# Patient Record
Sex: Female | Born: 1980
Health system: Northeastern US, Community
[De-identification: ages and names within clinical notes are randomized; demographics above are authoritative.]

## PROBLEM LIST (undated history)

## (undated) DIAGNOSIS — I10 Essential (primary) hypertension: Secondary | ICD-10-CM

## (undated) DIAGNOSIS — O1490 Unspecified pre-eclampsia, unspecified trimester: Secondary | ICD-10-CM

## (undated) DIAGNOSIS — R102 Pelvic and perineal pain: Secondary | ICD-10-CM

## (undated) DIAGNOSIS — N926 Irregular menstruation, unspecified: Secondary | ICD-10-CM

## (undated) HISTORY — DX: Unspecified pre-eclampsia, unspecified trimester: O14.90

## (undated) HISTORY — DX: Pelvic and perineal pain: R10.2

## (undated) HISTORY — PX: TONSILLECTOMY ONE-HALF AGE 12/>: ENT171

## (undated) HISTORY — DX: Irregular menstruation, unspecified: N92.6

## (undated) HISTORY — PX: OB ANTEPARTUM CARE CESAREAN DLVR & POSTPARTUM: REP299

## (undated) HISTORY — PX: UNLISTED PROCEDURE PHARYNX ADENOIDS/TONSILS: ENT194

---

## 2002-11-02 ENCOUNTER — Encounter (HOSPITAL_BASED_OUTPATIENT_CLINIC_OR_DEPARTMENT_OTHER): Payer: Self-pay | Admitting: Internal Medicine

## 2002-11-02 ENCOUNTER — Encounter (HOSPITAL_BASED_OUTPATIENT_CLINIC_OR_DEPARTMENT_OTHER): Payer: Self-pay

## 2002-11-04 ENCOUNTER — Encounter (HOSPITAL_BASED_OUTPATIENT_CLINIC_OR_DEPARTMENT_OTHER): Payer: Self-pay | Admitting: Internal Medicine

## 2003-01-20 ENCOUNTER — Ambulatory Visit (HOSPITAL_BASED_OUTPATIENT_CLINIC_OR_DEPARTMENT_OTHER): Payer: Self-pay | Admitting: Internal Medicine

## 2003-01-20 VITALS — BP 100/58 | Wt 95.0 lb

## 2003-01-20 DIAGNOSIS — Z3009 Encounter for other general counseling and advice on contraception: Principal | ICD-10-CM

## 2003-01-20 MED ORDER — DEPO-PROVERA 150 MG/ML IM SUSP
INTRAMUSCULAR | Status: DC
Start: 2003-01-20 — End: 2003-03-31

## 2003-01-20 NOTE — Progress Notes (Signed)
Casey [name] is a 22 year old female who is here for a followup depo    Date last pap: needs pap.  Last Depo-Provera: 11/04/02.  Side Effects if any: none, really likes it, no bleeding, no headaches, no nausea,no abdominal pain.  Serum HCG indicated? no.  Depo-Provera 150 mg IM Lot# [id number] exp 5/07 given by: Tyler Pita left gluteal wihtout problems .  Next appointment due March 24-April 8th.

## 2003-03-31 ENCOUNTER — Ambulatory Visit (HOSPITAL_BASED_OUTPATIENT_CLINIC_OR_DEPARTMENT_OTHER): Payer: PRIVATE HEALTH INSURANCE

## 2003-03-31 VITALS — BP 100/52 | Wt 100.0 lb

## 2003-03-31 DIAGNOSIS — Z3009 Encounter for other general counseling and advice on contraception: Principal | ICD-10-CM

## 2003-03-31 MED ORDER — DEPO-PROVERA 150 MG/ML IM SUSP
INTRAMUSCULAR | Status: DC
Start: 2003-03-31 — End: 2003-11-28

## 2003-03-31 NOTE — Nursing Note (Signed)
>>   Casey [name]                03/31/2003 9:39 am  Orson Gear interpretered the visit.  Pt was here for her Depoprovera injection Lot # [id number] exp date 5/07. injection was given in her right buttocks with difficulty. Pt denies any problems or questions. pt is to return to clinic either the week of June 25,July 2, or July 9.

## 2003-06-09 ENCOUNTER — Encounter (HOSPITAL_BASED_OUTPATIENT_CLINIC_OR_DEPARTMENT_OTHER): Payer: PRIVATE HEALTH INSURANCE

## 2003-06-16 ENCOUNTER — Ambulatory Visit (HOSPITAL_BASED_OUTPATIENT_CLINIC_OR_DEPARTMENT_OTHER): Payer: PRIVATE HEALTH INSURANCE

## 2003-06-16 VITALS — BP 118/80 | Wt 102.0 lb

## 2003-06-16 DIAGNOSIS — Z3009 Encounter for other general counseling and advice on contraception: Principal | ICD-10-CM

## 2003-06-16 LAB — URINE PREGNANCY TEST (POINT OF CARE): HCG QUALITATIVE URINE: NEGATIVE

## 2003-06-16 MED ORDER — DEPO-PROVERA 150 MG/ML IM SUSP
INTRAMUSCULAR | Status: DC
Start: 2003-06-16 — End: 2003-11-28

## 2003-06-16 NOTE — Nursing Note (Signed)
>>   KNIGHT, JENNIFER                  06/16/2003 1:31 pm  Pt was here for her Depoprovera injection Lot [id number] exp date 6/7  Injection was given in her left gluteal without difficulty. pt is RTC the week of either 08/25/03- 09/08/03 for her next Depoprovera injection. pt denies any questions at this time. pt asked if she has to keep getting pregnancy test before every test. i explianed that i did not know she was getting a pregnancy test before every Depo shot. I explained to her that i will check with her PCP and see if this should continue. Wilson presnt during visit.

## 2003-06-23 ENCOUNTER — Encounter (HOSPITAL_BASED_OUTPATIENT_CLINIC_OR_DEPARTMENT_OTHER): Payer: PRIVATE HEALTH INSURANCE

## 2003-08-25 ENCOUNTER — Ambulatory Visit (HOSPITAL_BASED_OUTPATIENT_CLINIC_OR_DEPARTMENT_OTHER): Payer: PRIVATE HEALTH INSURANCE

## 2003-08-25 ENCOUNTER — Encounter (HOSPITAL_BASED_OUTPATIENT_CLINIC_OR_DEPARTMENT_OTHER): Payer: PRIVATE HEALTH INSURANCE

## 2003-08-25 VITALS — BP 100/64 | Wt 104.0 lb

## 2003-08-25 DIAGNOSIS — Z3009 Encounter for other general counseling and advice on contraception: Principal | ICD-10-CM

## 2003-08-25 DIAGNOSIS — Z309 Encounter for contraceptive management, unspecified: Secondary | ICD-10-CM | POA: Insufficient documentation

## 2003-08-25 MED ORDER — DEPO-PROVERA 150 MG/ML IM SUSP
INTRAMUSCULAR | Status: DC
Start: 2003-08-25 — End: 2003-11-28

## 2003-08-25 NOTE — Nursing Note (Signed)
>>   Casey [name]                08/25/2003 10:10 am  Interpreter Madaline Guthrie was present during the beginning of the visit. pt denies any questions.  Pt was here for her Depoprovera injection Lot [id number] exp date 05/2006 Injection was given in her left buttocks without difficulty. pt is RTC the week of either Novemeber 19 - Nov 17 2003 for her next Depoprovera injection. pt denies any questions at this time

## 2003-11-17 ENCOUNTER — Ambulatory Visit (HOSPITAL_BASED_OUTPATIENT_CLINIC_OR_DEPARTMENT_OTHER): Payer: PRIVATE HEALTH INSURANCE

## 2003-11-17 VITALS — BP 100/60 | Wt 109.0 lb

## 2003-11-17 DIAGNOSIS — Z3009 Encounter for other general counseling and advice on contraception: Secondary | ICD-10-CM

## 2003-11-17 NOTE — Nursing Note (Signed)
>>   Casey [name]     11/20/2003   8:57 am  Orson Gear med assistance was present to interpreter for pt. Pt was here for her Depoprovera injection Lot [id number] exp date 05/2006  Injection was given in her left buttocks without difficulty. pt is RTC the week of either Feb 11- Feb 25        for her next Depoprovera injection. pt denies any questions at this time

## 2003-11-20 MED ORDER — DEPO-PROVERA 150 MG/ML IM SUSP
INTRAMUSCULAR | Status: DC
Start: 2003-11-20 — End: 2005-08-06

## 2003-11-28 ENCOUNTER — Ambulatory Visit (HOSPITAL_BASED_OUTPATIENT_CLINIC_OR_DEPARTMENT_OTHER): Payer: PRIVATE HEALTH INSURANCE | Admitting: Internal Medicine

## 2003-11-28 ENCOUNTER — Encounter (HOSPITAL_BASED_OUTPATIENT_CLINIC_OR_DEPARTMENT_OTHER): Payer: Self-pay | Admitting: Internal Medicine

## 2003-11-28 VITALS — BP 100/60 | Wt 107.0 lb

## 2003-11-28 DIAGNOSIS — Z0000 Encounter for general adult medical examination without abnormal findings: Secondary | ICD-10-CM

## 2003-11-28 DIAGNOSIS — N83209 Unspecified ovarian cyst, unspecified side: Secondary | ICD-10-CM

## 2003-11-28 DIAGNOSIS — N949 Unspecified condition associated with female genital organs and menstrual cycle: Secondary | ICD-10-CM

## 2003-11-28 LAB — CYTOPATH, C/V, THIN LAYER

## 2003-11-28 NOTE — Progress Notes (Signed)
Casey [name] is a 22 year old female here with some pelvic cramping for one month, not constant, located more on the left, which she thinks feels like her ovarian cyst that she had a few years ago. The patient denies dysuria, frequency or hematuria. No vagainal discharge. SA, one partner. Last papa 2 years ago.     Allergies: Review of patient's allergies indicates no known allergies.   No LMP recorded.    ROS: Feeling well. No dyspnea or chest pain on exertion. No abdominal pain, change in bowel habits, black or bloody stools. No urinary tract symptoms. GYN ROS: no breast pain or new or enlarging lumps on self exam, no side effects of hormonal medications, no vaginal bleeding.    OBJECTIVE:   The patient appears well, in NAD.   BP 100/60  Wt 107 lbs (48.5kg)  ENT normal. Neck supple. No adenopathy or thyromegaly. PERLA. Clear, good air entry, no wheezes, rhonci or rales. S1 and S2 normal, no murmurs, regular rate and rhythm. No edema. Abdomen soft without tenderness, guarding, mass or organomegaly.    BREAST EXAM: normal without suspicious masses, skin or nipple changes or axillary nodes, symmetric fibrous changes in both upper outer quadrants    PELVIC EXAM: normal vagina and vulva, normal cervix without lesions, polyps or tenderness, uterus normal size, shape, consistency, no mass or tenderness, adnexa normal in size without mass or tenderness    ASSESSMENT:     well woman  no contraindication to continue hormonal therapy      PLAN:   refer to [place] to check for left ovarian cyst  GC/ chlam  pap smear  UTD on tetanus  RTc when next depo due  return annually or prn

## 2003-11-29 LAB — IADNA NEISSERIA GONORRHOEAE DIRECT PROBE TQ: GENPROBE GC: NEGATIVE

## 2003-11-29 LAB — IADNA CHLAMYDIA TRACHOMATIS DIRECT PROBE TQ: GENPROBE CHLAMYDIA: NEGATIVE

## 2003-12-11 ENCOUNTER — Encounter (HOSPITAL_BASED_OUTPATIENT_CLINIC_OR_DEPARTMENT_OTHER): Payer: Self-pay | Admitting: Internal Medicine

## 2003-12-22 ENCOUNTER — Other Ambulatory Visit: Payer: Self-pay | Admitting: Internal Medicine

## 2003-12-22 DIAGNOSIS — R1032 Left lower quadrant pain: Principal | ICD-10-CM

## 2003-12-28 LAB — US PELVIC NON-PREGNANT

## 2003-12-28 LAB — US TRANSVAGINAL NON-OB

## 2004-02-09 ENCOUNTER — Ambulatory Visit (HOSPITAL_BASED_OUTPATIENT_CLINIC_OR_DEPARTMENT_OTHER): Payer: Self-pay

## 2004-02-09 DIAGNOSIS — Z3009 Encounter for other general counseling and advice on contraception: Secondary | ICD-10-CM

## 2004-02-09 NOTE — Nursing Note (Signed)
>>   [name], Casey     02/12/2004   12:25 pm  Pt was here for her Depoprovera injection Lot  exp date 8/07  Injection was given in her right buttocks without difficulty. pt is RTC the week of either Apr 17, 2004 - May 01, 2004 for her next Depoprovera injection. pt denies any questions at this time. Interpeter via phone was used for the beginning of the visit   pt recently saw pcp for pap smearc

## 2004-02-12 MED ORDER — DEPO-PROVERA 150 MG/ML IM SUSP
INTRAMUSCULAR | Status: AC
Start: 2004-02-12 — End: 2004-02-12

## 2004-12-11 ENCOUNTER — Ambulatory Visit (HOSPITAL_BASED_OUTPATIENT_CLINIC_OR_DEPARTMENT_OTHER): Payer: Charity

## 2004-12-11 ENCOUNTER — Encounter (HOSPITAL_BASED_OUTPATIENT_CLINIC_OR_DEPARTMENT_OTHER): Payer: Self-pay

## 2004-12-11 VITALS — BP 100/60 | HR 76 | Temp 99.1°F | Resp 12 | Ht <= 58 in | Wt 97.0 lb

## 2004-12-11 DIAGNOSIS — Z3009 Encounter for other general counseling and advice on contraception: Secondary | ICD-10-CM

## 2004-12-11 DIAGNOSIS — N912 Amenorrhea, unspecified: Secondary | ICD-10-CM

## 2004-12-11 LAB — URINE PREGNANCY TEST (POINT OF CARE): HCG QUALITATIVE URINE: NEGATIVE

## 2004-12-11 NOTE — Progress Notes (Signed)
History:  Date of last menstrual period :  Patient's last menstrual period was 11/13/2004.  It was On time.  Was it a normal period(flow,duration, etc.)? yes    Are you using any kind of contraception? No    Any unprotected intercourse in the past 2 months? yes  Any unprotected intercourse in the past 3 days?* yes  (* may be a candidate for emergency contraceptions)    Have you ever been pregnant in the past? yes  Any symptoms of pregnancy? Yes, breast tenderness.    If the test is negative, how will you feel? mixed feelings    If the test is positive, how will you feel? confused    Test Result: Negative    Education/Counseling Provided:  Test is Negative: contraception  follow up pap visit  follow up hcg jan 10 serum for depo she will nothave unprotected intercourse

## 2004-12-24 ENCOUNTER — Ambulatory Visit (HOSPITAL_BASED_OUTPATIENT_CLINIC_OR_DEPARTMENT_OTHER): Payer: Charity

## 2004-12-24 VITALS — BP 100/60 | HR 98 | Temp 99.0°F | Resp 12 | Ht <= 58 in | Wt 97.0 lb

## 2004-12-24 DIAGNOSIS — Z3009 Encounter for other general counseling and advice on contraception: Principal | ICD-10-CM

## 2004-12-24 LAB — URINE PREGNANCY TEST (POINT OF CARE): HCG QUALITATIVE URINE: NEGATIVE

## 2004-12-24 MED ORDER — DEPO-PROVERA 150 MG/ML IM SUSP
INTRAMUSCULAR | Status: DC
Start: 2004-12-24 — End: 2005-08-06

## 2004-12-24 NOTE — Nursing Note (Signed)
>>   Casey [name]     12/24/2004   11:47 am  Depo lot # , exp 06/2007, to left buttock as ordered. [place] interpreter present. Pt. denies any questions.

## 2004-12-24 NOTE — Progress Notes (Signed)
Casey [name], 24 year old, presents today for an initial Depo Provera injection.     There is no previous medical history on file.    There is no previous surgical history on file.    Obstetric History   G1 P1 T0 P0 TAB0 SAB0 E0 M0 L0     Social Risk:   Tobacco Use: Never    Alcohol Use: No    Sexually Active: Yes Partners with: Female      GYN EXAM: DUE WILL SCHEDULE    Last 5 LMP Dates  Dec 16, 2004  SHE DENIES BEING SEXUALLY ACTIVE SINCE LMP COUNSELLED PATIENT REGARDING DEPO  SHE WILL TAKE URINE HCG AS A PRECAUTIONARY MEASURE     Date: LMP:   11/13/2004    Last 2 Urine HCG tests:  HCG QUALITATIVE URINE (no units)   Date Value   12/11/2004 neg    06/16/2003 neg   ----------    Family Planning Consent: yes    Side effects reveiwed and patient understands:   Bleeding patterns: yes   Possible mood changes: yes   Changes to libido: yes    Injection given: yes    Patient instructed to return to the clinic the week of either   12 weeks for her next Depo-Provera injection.      Patient denies any questions at this time.

## 2005-03-05 ENCOUNTER — Ambulatory Visit (HOSPITAL_BASED_OUTPATIENT_CLINIC_OR_DEPARTMENT_OTHER): Payer: Charity

## 2005-03-05 VITALS — BP 100/60 | Wt 97.0 lb

## 2005-03-05 DIAGNOSIS — Z3009 Encounter for other general counseling and advice on contraception: Principal | ICD-10-CM

## 2005-03-05 MED ORDER — DEPO-PROVERA 150 MG/ML IM SUSP
INTRAMUSCULAR | Status: DC
Start: 2005-03-05 — End: 2005-08-06

## 2005-03-05 NOTE — Nursing Note (Signed)
>>   [name], Casey     03/05/2005   1:48 pm  Pt was here for her Depoprovera injection Lot # exp date 06/2007  Injection was given in her left buttocks without difficulty. pt is RTC between the weeks of May 31 - June 14 for her next Depoprovera injection. pt denies any questions at this time. Pt declined an interpreter for this visit

## 2005-05-14 ENCOUNTER — Ambulatory Visit (HOSPITAL_BASED_OUTPATIENT_CLINIC_OR_DEPARTMENT_OTHER): Payer: Charity | Admitting: Registered Nurse

## 2005-05-14 VITALS — BP 120/70 | Temp 100.4°F | Wt 100.0 lb

## 2005-05-14 DIAGNOSIS — Z3009 Encounter for other general counseling and advice on contraception: Secondary | ICD-10-CM

## 2005-05-14 MED ORDER — DEPO-PROVERA 150 MG/ML IM SUSP
INTRAMUSCULAR | Status: DC
Start: 2005-05-14 — End: 2006-12-02

## 2005-05-14 NOTE — Nursing Note (Signed)
>>   [name], Casey     05/14/2005   4:26 pm  Pt. here for Depo shot. Within the time frame for receiving Depo. Depo lot # exp 06/2007 to left buttock without difficulty. To return to clinic between August 9 and August 06, 2005. Pt denies any questions.

## 2005-07-23 ENCOUNTER — Ambulatory Visit (HOSPITAL_BASED_OUTPATIENT_CLINIC_OR_DEPARTMENT_OTHER): Payer: Charity | Admitting: Registered Nurse

## 2005-07-23 ENCOUNTER — Encounter (HOSPITAL_BASED_OUTPATIENT_CLINIC_OR_DEPARTMENT_OTHER): Payer: Self-pay | Admitting: Registered Nurse

## 2005-07-23 VITALS — BP 110/60 | Temp 99.2°F | Ht <= 58 in | Wt 103.0 lb

## 2005-07-23 DIAGNOSIS — Z3009 Encounter for other general counseling and advice on contraception: Principal | ICD-10-CM

## 2005-07-23 MED ORDER — DEPO-PROVERA 150 MG/ML IM SUSP
INTRAMUSCULAR | Status: DC
Start: 2005-07-23 — End: 2005-08-06

## 2005-07-23 NOTE — Nursing Note (Signed)
>>   BOWE, BARBARA     07/23/2005   10:28 am  Pt. here for Depo shot. Within the time frame for receiving Depo. Depo lot # exp 06/2007 to right buttock without difficulty. To return to clinic between Oct 18 and Oct 15, 2005. Pt denies any questions.   Unable to find documentation of a recent PAP. Appointment scheduled for 08/06/05 with Ladora Daniel. Pt advised that future depo shots depend on her keeping this appointment. She agreed.

## 2005-08-06 ENCOUNTER — Encounter (HOSPITAL_BASED_OUTPATIENT_CLINIC_OR_DEPARTMENT_OTHER): Payer: Self-pay

## 2005-08-06 ENCOUNTER — Ambulatory Visit (HOSPITAL_BASED_OUTPATIENT_CLINIC_OR_DEPARTMENT_OTHER): Payer: Charity

## 2005-08-06 VITALS — BP 100/60 | HR 60 | Temp 98.0°F | Resp 10 | Ht <= 58 in | Wt 105.0 lb

## 2005-08-06 DIAGNOSIS — I4949 Other premature depolarization: Secondary | ICD-10-CM

## 2005-08-06 DIAGNOSIS — F809 Developmental disorder of speech and language, unspecified: Secondary | ICD-10-CM | POA: Insufficient documentation

## 2005-08-06 DIAGNOSIS — Z0000 Encounter for general adult medical examination without abnormal findings: Secondary | ICD-10-CM

## 2005-08-06 LAB — CYTOPATH, C/V, THIN LAYER

## 2005-08-06 NOTE — Progress Notes (Signed)
SUBJECTIVE:   24 year old female for annual routine pap and checkup.    Current outpatient prescriptions:  DEPO-PROVERA 150 MG/ML IM SUSP, 1 ML EVERY 3 MONTHS, Disp: .9, Rfl: 0    Allergies: Review of patient's allergies indicates no known allergies.   No LMP date recorded. Reason: Injection.    ROS: Feeling well. No dyspnea or chest pain on exertion. No abdominal pain, change in bowel habits, black or bloody stools. No urinary tract symptoms. GYN ROS: no breast pain or new or enlarging lumps on self exam, no discharge or pelvic pain. HAPPY WITH DEPO, WOULD LIKE TO CONTINUE.    OBJECTIVE:   The patient appears well, in NAD.   BP 100/60   Pulse 60, regular   Temp (Src) 98 (Oral)   Resp 10   Ht 4\' 10"  (1.60m)   Wt 105 lbs (47.6kg)   LMP Injection  ENT normal. ABSENT UVULA AND ABNORMALITY OF TONGUE Neck supple. No adenopathy or thyromegaly. PERLA. Clear, good air entry, no wheezes, rhonci or rales. S1 and S2 normal, no murmurs, IRREGULAR RYTHYM. No edema. Abdomen soft without tenderness, guarding, mass or organomegaly.    BREAST EXAM: normal without suspicious masses, skin or nipple changes or axillary nodes, symmetric fibrous changes in both upper outer quadrants, self-exam is taught and encouraged    PELVIC EXAM: EGBUS within normal limits, normal cervix without lesions, polyps or tenderness, uterus normal size, shape, consistency, no mass or tenderness, adnexa normal in size without mass or tenderness    EKG reviewed by Dr.Soderlund, PVCs ?unifocal, pt asymptomatic    ASSESSMENT:   well woman  no contraindication to continue hormonal therapy  PVCs    PLAN:   pap smear, genprobe  counseled on breast self exam and family planning choices- reviewed effects of long term depo use, advised calcium and Vit D supplements  return annually or prn

## 2005-08-08 LAB — CHLAMYDIA GC NAAT
GENPROBE CHLAMYDIA: NEGATIVE
GENPROBE GC: NEGATIVE

## 2005-08-13 ENCOUNTER — Encounter (HOSPITAL_BASED_OUTPATIENT_CLINIC_OR_DEPARTMENT_OTHER): Payer: Self-pay

## 2005-10-02 ENCOUNTER — Ambulatory Visit (HOSPITAL_BASED_OUTPATIENT_CLINIC_OR_DEPARTMENT_OTHER): Payer: Charity | Admitting: Registered Nurse

## 2005-10-02 VITALS — BP 100/60 | Wt 106.0 lb

## 2005-10-02 DIAGNOSIS — Z3009 Encounter for other general counseling and advice on contraception: Principal | ICD-10-CM

## 2005-10-02 MED ORDER — DEPO-PROVERA 150 MG/ML IM SUSP
INTRAMUSCULAR | Status: DC
Start: 2005-10-02 — End: 2006-12-02

## 2005-10-02 NOTE — Nursing Note (Signed)
>>   [name], Casey     10/02/2005   9:56 am  Pt. here for Depo shot. Within the time frame for receiving Depo. Depo lot # exp 07/2007 to right buttock without difficulty. To return to clinic between Dec 11, 2005 and Dec 25, 2005. Pt denies any questions.

## 2005-12-24 ENCOUNTER — Ambulatory Visit (HOSPITAL_BASED_OUTPATIENT_CLINIC_OR_DEPARTMENT_OTHER): Payer: Charity | Admitting: Registered Nurse

## 2005-12-24 VITALS — BP 110/70 | Temp 98.6°F | Ht <= 58 in | Wt 104.0 lb

## 2005-12-24 DIAGNOSIS — Z3009 Encounter for other general counseling and advice on contraception: Secondary | ICD-10-CM

## 2005-12-24 MED ORDER — DEPO-PROVERA 150 MG/ML IM SUSP
INTRAMUSCULAR | Status: DC
Start: 2005-12-24 — End: 2006-12-02

## 2005-12-24 NOTE — Nursing Note (Signed)
>>   BOWE, BARBARA     12/24/2005   2:45 pm  Pt. here for Depo shot. Within the time frame for receiving Depo. Depo lot # exp 07/2007 to left buttock without difficulty. To return to clinic between March 20 to March 17, 2006. Pt denies any questions.

## 2006-03-11 ENCOUNTER — Ambulatory Visit (HOSPITAL_BASED_OUTPATIENT_CLINIC_OR_DEPARTMENT_OTHER): Payer: Charity

## 2006-03-11 VITALS — BP 90/60 | Temp 97.8°F | Wt 104.0 lb

## 2006-03-11 DIAGNOSIS — Z3009 Encounter for other general counseling and advice on contraception: Principal | ICD-10-CM

## 2006-03-11 MED ORDER — DEPO-PROVERA 150 MG/ML IM SUSP
INTRAMUSCULAR | Status: DC
Start: 2006-03-11 — End: 2006-12-02

## 2006-03-11 NOTE — Progress Notes (Deleted)
Casey [name], 25 year old, presents today for an initial Depo Provera injection.     No past medical history on file.    No past surgical history on file.    Obstetric History   G1 P1 T0 P0 TAB0 SAB0 E0 M0 L0     Social Risk:   Tobacco Use: Never    Alcohol Use: No    Sexual Activity: Yes Partners with: Female   Birth Control/Protection: Injection      GYN EXAM: ***    Last 5 LMP Dates  Last LMP Date(s)   Date: LMP:   12/11/2004 11/13/2004    Last 2 Urine HCG tests:  HCG QUALITATIVE URINE (no units)  Date Value  12/24/2004 neg   12/11/2004 neg   ----------    Family Planning Consent: {YES/NO:11106::"yes"}    Side effects reveiwed and patient understands:   Bleeding patterns: {YES/NO:11106::"yes"}   Possible mood changes: {YES/NO:11106::"yes"}   Changes to libido: {YES/NO:11106::"yes"}    Injection given: {YES/NO* * *:64::"Yes"}    Patient instructed to return to the clinic the week of either *** or *** for her next Depo-Provera injection.    Patient denies any questions at this time.

## 2006-03-11 NOTE — Nursing Note (Signed)
>>   CANZANELLI, LISA     03/11/2006   10:07 am  Pt was here for her Depo-Provera injection Lot #[id number] exp date 07/2009. Pt is within the time frame for receiving the injection. Injection was given in her right buttock without difficulty. Pt is RTC between the weeks of 6/6-6/20  for her next Depo-Provera injection. Pt denies any questions at this time.

## 2006-03-11 NOTE — Patient Instructions (Signed)
Return to clinic between June 6 and June 20 for your next injection.

## 2006-05-20 ENCOUNTER — Ambulatory Visit (HOSPITAL_BASED_OUTPATIENT_CLINIC_OR_DEPARTMENT_OTHER): Payer: Charity | Admitting: Registered Nurse

## 2006-05-20 ENCOUNTER — Encounter (HOSPITAL_BASED_OUTPATIENT_CLINIC_OR_DEPARTMENT_OTHER): Payer: Self-pay | Admitting: Internal Medicine

## 2006-05-20 DIAGNOSIS — Z3009 Encounter for other general counseling and advice on contraception: Secondary | ICD-10-CM

## 2006-05-20 MED ORDER — DEPO-PROVERA 150 MG/ML IM SUSP
INTRAMUSCULAR | Status: DC
Start: 2006-05-20 — End: 2006-05-20

## 2006-05-20 NOTE — Progress Notes (Signed)
Casey [name], 25 year old, presents today for a repeat Depo Provera injection.     Date of last Injection: 03/11/06    Any changes in:   Bleeding patterns: No   Possible mood changes: No   Changes to libido: No    Injection given: Yes    Patient instructed to return to the clinic the week of either Aug 15 or Aug 29 for her next Depo-Provera injection.    Patient denies any questions at this time.

## 2006-08-12 ENCOUNTER — Ambulatory Visit (HOSPITAL_BASED_OUTPATIENT_CLINIC_OR_DEPARTMENT_OTHER): Payer: Charity | Admitting: Registered Nurse

## 2006-08-12 VITALS — BP 128/70 | Temp 99.1°F | Wt 111.0 lb

## 2006-08-12 DIAGNOSIS — Z3009 Encounter for other general counseling and advice on contraception: Secondary | ICD-10-CM

## 2006-08-12 NOTE — Nursing Note (Signed)
>>   [name], Casey     08/12/2006   10:30 am  Pt. here for Depo shot. Within the time frame for receiving Depo. Depo lot # [id number] exp 07/2009 to left buttock without difficulty. To return to clinic between Oct 21, 2006 to 11/04/06 for next depo. ACHES reviewed. Pt denies any problems/questions.

## 2006-11-18 ENCOUNTER — Encounter (HOSPITAL_BASED_OUTPATIENT_CLINIC_OR_DEPARTMENT_OTHER): Payer: Self-pay

## 2006-11-18 ENCOUNTER — Ambulatory Visit (HOSPITAL_BASED_OUTPATIENT_CLINIC_OR_DEPARTMENT_OTHER): Payer: PRIVATE HEALTH INSURANCE

## 2006-11-18 DIAGNOSIS — Z3009 Encounter for other general counseling and advice on contraception: Secondary | ICD-10-CM

## 2006-11-18 NOTE — Progress Notes (Signed)
Family Planning Counseling Note:  S: History   Reason for Visit: talk about birth control, maybe get IUD  Current meds:[place] OPCs. Allergies: NKDA.    ETOH: none. Drugs: none. Cigarettes per day: no    LMP: 3 years ago Pt has been using Depo Provera   Menarche: 9 or 10 years y.o.   Regular cycles? Before Depo Provera yes  (how often: monthly #days: 5 amt.: average cramping: some)  Age of First Intercourse: 25 y.o.   Gravida: 1 Para: 1 SAB: 0 TAB: 0    Current Partner: 7 years with same partner   # of partners: 1 for 7 years  Sexual Partners: male  Sexual Practices: vaginal   DV Assessment completed. The Patient reported No Hx of DV/IP Abuse, No Hx of sexual assault, No Sexual Coersion.    Current method of contraception: Pt was using Depo Provera for 3 years. She is gaining weight and does not like missing her period. She decided not to get her last Depo Provera shot at the end of Nov. And began taking [place] OCPs on 10/30/06.   Satisfaction with Method: No    Date of last SA: more than week ago  Protection used: Yes   UCG required: No   ECC Need: No  Condom UJW:[id number]        Condom Instruction Given:       Hx of STDs (dx/tx): Tested Before?:No   Tested for GC and Chlamydia   HIV: 5 years ago during pregnancy    Testing Offered: Yes   Testing Accepted: No    GYN: Last GYN exam (date/result): 07/2005 The Pt does not have a Hx of abnormal PAP.    O: Pregnancy Test Result, if done: not done           A: FP               P: Education/Counseling and referrals:  Breast/testicular self-exams   Anatomy/physiology  GYN exam   Pregnancy  Contraceptive overview  Pt would like a Pargard IUD   Disc: Risks, Benefits, Warning signs, Effectiveness, Side effects, Instructions , Follow up  Consent for treatment   Informed Consent signed for: Family Planning and IUD  STD/HIV risk assessment done   STD/HIV prevention/safer sex discussed  Condoms offered and refused  Emergency Contraception discussed  Confidentiality  Clinic  contact number both day and after hours/emergency   Handouts given    Follow up:Pt will RTC to see EDewitt Hoes for PAP, genprobe, and to schedule IUD insetion

## 2006-12-02 ENCOUNTER — Ambulatory Visit (HOSPITAL_BASED_OUTPATIENT_CLINIC_OR_DEPARTMENT_OTHER): Payer: PRIVATE HEALTH INSURANCE

## 2006-12-02 ENCOUNTER — Encounter (HOSPITAL_BASED_OUTPATIENT_CLINIC_OR_DEPARTMENT_OTHER): Payer: Self-pay

## 2006-12-02 VITALS — BP 128/78 | HR 97 | Wt 114.0 lb

## 2006-12-02 DIAGNOSIS — Z0000 Encounter for general adult medical examination without abnormal findings: Secondary | ICD-10-CM

## 2006-12-02 DIAGNOSIS — Z3009 Encounter for other general counseling and advice on contraception: Secondary | ICD-10-CM

## 2006-12-02 NOTE — Progress Notes (Signed)
SUBJECTIVE:   25 year old female for annual routine pap and checkup.  No current outpatient prescriptions on file.    Allergies: Review of patient's allergies indicates no known allergies.   No LMP date recorded. Reason: Injection.    Patient Active Problem List:   CONTRACEPTIVE MANGMT NEC [V25.09]   SPEECH/LANGUAGE DISORDER [315.3]    Current outpatient prescriptions prior to 12/02/06:  DISCONTD: DEPO-PROVERA 150 MG/ML IM SUSP, INTIATE TODAY; REPEAT EVERY 10 TO 13 WEEKS FOR CONTRACEPTION, Disp: 1, Rfl: 3  DISCONTD: DEPO-PROVERA 150 MG/ML IM SUSP, 1 ML EVERY 3 MONTHS, Disp: .9, Rfl: 0  DISCONTD: DEPO-PROVERA 150 MG/ML IM SUSP, 1 ML EVERY 3 MONTHS, Disp: .9, Rfl: 0  DISCONTD: DEPO-PROVERA 150 MG/ML IM SUSP, 1 ML EVERY 3 MONTHS, Disp: .9, Rfl: 0        ROS: Feeling well. No dyspnea or chest pain on exertion. No abdominal pain, change in bowel habits, black or bloody stools. No urinary tract symptoms. GYN ROS: no breast pain or new or enlarging lumps on self exam. Amen s/p depo. Currently using [place] OCP until IUD insertion, signed consent for paragard IUD.    No past medical history on file.  Past Surgical History:   THROAT SURGERY PROCEDURE    Comment: age 82   CESAREAN DELIVERY    Comment: age 35    Review of patient's family history indicates:   In Good Health Mother    In Good Health Father    Cancer - Other FamHxNeg    Diabetes FamHxNeg    Heart FamHxNeg       Social History   Marital Status: Single Spouse Name:    Years of Education: Number of children: 1     Occupational History  Occupation Radio producer     Social History Main Topics   Tobacco Use: Never    Alcohol Use: Yes    Comment: special occasions   Drug Use: No    Sexual Activity: Yes Partners with: Female   Birth Control/Protection: Pill, Injection    Other Topics Concern  CAFFEINE No   Comment: 1 cup coffee/day  SLEEP CONCERN No  STRESS CONCERN No  EXERCISE Yes  SELF EXAMS Yes    Social History Narrative   12/02/2006 lives with  partner of 7 yrs, 1 son- Benjie Karvonen born 2002    OBJECTIVE:   The patient appears well, in NAD.   BP 128/78  Pulse 97  Wt 114 lbs (51.7kg)  SaO2 100%  LMP Injection  ENT normal. Neck supple. No adenopathy or thyromegaly. PERLA. Clear, good air entry, no wheezes, rhonci or rales. S1 and S2 normal, no murmurs, regular rate and rhythm. No edema. Abdomen soft without tenderness, guarding, mass or organomegaly.    BREAST EXAM: normal without suspicious masses, skin or nipple changes or axillary nodes, symmetric fibrous changes in both upper outer quadrants, self-exam is taught and encouraged    PELVIC EXAM: normal vagina and vulva, EGBUS within normal limits, normal cervix without lesions, polyps or tenderness, nulliparous os, uterus normal size, shape, consistency, no mass or tenderness, adnexa normal in size without mass or tenderness    ASSESSMENT:     well woman  no contraindication to continue use of oral contraceptives      PLAN:     pap smear/genprobe  counseled on breast self exam and family planning choice  Continue OCP until IUD insertion- schedule 3-4 weeks, call if menses    Discussed risks of IUD  insertion:   Uterus punctured during placement: less than 1 per 100;   Pelvic infection within 30 days of placement: less than 1 per 100  IUD slips from placement or falls out during first year: 1-7

## 2006-12-03 LAB — CHLAMYDIA GC NAAT
GENPROBE CHLAMYDIA: NEGATIVE
GENPROBE GC: NEGATIVE

## 2006-12-12 LAB — CYTOPATH, C/V, THIN LAYER

## 2006-12-30 ENCOUNTER — Ambulatory Visit (HOSPITAL_BASED_OUTPATIENT_CLINIC_OR_DEPARTMENT_OTHER): Payer: PRIVATE HEALTH INSURANCE

## 2006-12-30 ENCOUNTER — Encounter (HOSPITAL_BASED_OUTPATIENT_CLINIC_OR_DEPARTMENT_OTHER): Payer: Self-pay

## 2006-12-30 DIAGNOSIS — Z23 Encounter for immunization: Secondary | ICD-10-CM

## 2006-12-30 LAB — URINE PREGNANCY TEST (POINT OF CARE): HCG QUALITATIVE URINE: NEGATIVE

## 2006-12-30 NOTE — Progress Notes (Signed)
PATIENT/PROCEDURE VERIFICATION DOCUMENTATION    Correct patient: Yes  Correct procedure: Yes  Correct side, site, mark visible if applicable: Yes  Correct position: Yes  Special equipment/implant(s) present, if applicable: Yes    Time-out completed, documented by provider doing procedure or designated team member:  Ladora Daniel, APRN 12/30/2006 30:47 PM  26 year old, female, Obstetric History   G1 P1 T0 P0 TAB0 SAB0 E0 M0 L0   Patient's last menstrual period was 12/26/2006.    She is here for an IUD insertion.    Currently she is sexually active, monogomous    She denies current symptoms of possible pelvic infection, recent pelvic infection, pregnancy, rheumatic or congenital heart disease and allergy to IUD materials.    Completed Labs: negative pregnancy tests, negative test for gonorrhea, negative test for chlamydia and normal pap smear within the past 12 months    Patient has verbalized understanding of risks and benefits and has signed the consent form. Current statistical risk of pregnancy with IUD is 0.3-1%. Current statistical risk of of ectopic pregnancy with IUD is low but if pregnancy occurs ectopic risk is about 35%.    EXAM:  Uterus: normal size, non-tender and midline    PROCEDURE:  Cervix prepped with betadine. Tenaculum applied to anterior lip of the cervix. Sounded to 8cm.    IUD inserted easily.    Strings trimmed.  IUD type: Paraguard T-380    Instructions given to patient regarding checking IUD strings, anticipated vaginal bleeding and warning signs. Instructed to continue to track menstrual cycles and call for any missed periods or abnormal bleeding or pain.    Follow up appointment in 6 weeks.12/30/2006  VIS given prior to administration and reviewed with the patient and or legal guardian. Patient understands the disease and the vaccine. See immunization/Injection module or chart review for date of publication and additional information.  Ladora Daniel, APRN    V25.1 INSERTION OF IUD (primary  encounter diagnosis)  Plan: INSERT INTRAUTERINE DEVICE, INTRAUT COPPER    CONTRACEPTIVE, URINE PREGNANCY TEST (POINT OF    CARE)    V03.7 TETANUS TOXOID INOCULAT  Note: pt brought td records- last in 1997, due   Plan: IMMUNIZATION ADMIN SINGLE, RN, TDAP VACCINE IM

## 2007-01-04 ENCOUNTER — Emergency Department (HOSPITAL_BASED_OUTPATIENT_CLINIC_OR_DEPARTMENT_OTHER): Payer: Self-pay

## 2007-01-07 LAB — EMERGENCY ROOM NOTE

## 2007-02-11 ENCOUNTER — Encounter (HOSPITAL_BASED_OUTPATIENT_CLINIC_OR_DEPARTMENT_OTHER): Payer: Self-pay

## 2007-02-11 ENCOUNTER — Ambulatory Visit (HOSPITAL_BASED_OUTPATIENT_CLINIC_OR_DEPARTMENT_OTHER): Payer: PRIVATE HEALTH INSURANCE

## 2007-02-11 VITALS — BP 110/60 | HR 105 | Wt 114.0 lb

## 2007-02-11 DIAGNOSIS — D649 Anemia, unspecified: Principal | ICD-10-CM

## 2007-02-11 DIAGNOSIS — Z30431 Encounter for routine checking of intrauterine contraceptive device: Secondary | ICD-10-CM

## 2007-02-11 DIAGNOSIS — N926 Irregular menstruation, unspecified: Secondary | ICD-10-CM

## 2007-02-11 LAB — BLOOD COUNT COMPLETE AUTOMATED
HEMATOCRIT: 42.7 % (ref 36.0–48.0)
HEMOGLOBIN: 14.7 g/dl (ref 12.0–16.0)
MEAN CORP HGB CONC: 34.5 g/dl (ref 32.0–36.0)
MEAN CORPUSCULAR HGB: 32.4 pg (ref 27.0–33.0)
MEAN CORPUSCULAR VOL: 93.9 fl (ref 80.0–100.0)
MEAN PLATELET VOLUME: 8.8 fl (ref 6.4–10.8)
PLATELET COUNT: 319 10*3/uL (ref 150–400)
RBC DISTRIBUTION WIDTH: 11.9 % (ref 11.5–14.3)
RED BLOOD CELL COUNT: 4.55 M/uL (ref 4.50–5.10)
WHITE BLOOD CELL COUNT: 9 10*3/uL (ref 4.0–10.8)

## 2007-02-11 LAB — CHG ASSAY OF FERRITIN: FERRITIN: 68 ng/mL (ref 11–307)

## 2007-02-11 MED ORDER — LO/OVRAL (28) 0.3-30 MG-MCG PO TABS
ORAL_TABLET | ORAL | Status: DC
Start: 2007-02-11 — End: 2007-06-17

## 2007-02-11 NOTE — Progress Notes (Signed)
S/ Casey [name] is a 26 year old female here for IUD follow-up    She had pain and heavy bleeding for 4 days s/p placement, went to ER. Reassured. Spotting since x 6 wks, light, some days no bleeding, denies pain.    Last depo 8/29/7, she has not had regular periods since.    Past hx anemia, she would like to check.    O/  Vagina and vulva are normal; LIght bleeding present in vault. Cervix normal without lesions. IUD strings present. Uterus anteverted and mobile, normal in size and shape without tenderness. Adnexa normal in size without masses or tenderness.     285.9 ANEMIA NOS (primary encounter diagnosis)  Plan: COMPLETE CBC, AUTOMATED, ASSAY OF FERRITIN    V25.42 IUD SURVEILLANCE  Note: nl exam    626.4 IRREGULAR MENSTRUATION  S/p depo  S/p iud placement  Plan: LO/OVRAL (28) 0.3-30 MG-MCG OR TABS  Ocp x 1 month to stop bleeding. Consent signed.     Recheck as needed for persistence, worsening, appearance of new   Symptoms.

## 2007-03-16 ENCOUNTER — Other Ambulatory Visit (HOSPITAL_BASED_OUTPATIENT_CLINIC_OR_DEPARTMENT_OTHER): Payer: Self-pay

## 2007-03-16 DIAGNOSIS — N926 Irregular menstruation, unspecified: Secondary | ICD-10-CM

## 2007-03-16 NOTE — Telephone Encounter (Signed)
Due to the language barrier, the phone call was conducted in [place] with an interpreter. The interpreter's name is wilson. The interpreter was on the phone during the entire phone call.   tc to pt no answer I left a message for pt to call back so that i can inquire about the bleeding that she was having last month.

## 2007-03-16 NOTE — Telephone Encounter (Signed)
Staff Message copied by Odis Luster on Tue Mar 16, 2007 4:14 PM  ------   Message from: Maryclare Bean, MIRNA   Created: Tue Mar 16, 2007 3:40 PM   Regarding: refill   Contact: [phone]    Casey [name] [id number], 26 year old, female, Telephone Information:  Home Phone [phone]  Work Phone [phone]      Cleotis Lema NUMBER: [phone]  Cell phone:   Other phone:    Available times:    Patient's language of care: [place]    Patient needs a [place] interpreter.    Patient's PCP: Ladora Daniel, APRN    Person calling on behalf of patient: patient (self)    Calls today for med refill(s).Birth control [facility]Thanks    Patient's Preferred Pharmacy:   No Pharmacies Listed

## 2007-03-16 NOTE — Telephone Encounter (Signed)
Due to the language barrier, the phone call was conducted in [place] with an interpreter. The interpreter's name is Alvino Chapel. The interpreter was on the phone during the entire phone call.  Pt called back and she informed me that she is not having any bleeding at present time. I explained that she wants to be on the ocp for one month. Pt will call back is she has any further bleeding. Pt agreed

## 2007-06-16 ENCOUNTER — Telehealth (HOSPITAL_BASED_OUTPATIENT_CLINIC_OR_DEPARTMENT_OTHER): Payer: Self-pay

## 2007-06-16 NOTE — Telephone Encounter (Signed)
Staff Message copied by Stark Bray on Wed Jun 16, 2007 3:37 PM  ------   Message from: AFIFE, CELESTE   Created: Wed Jun 16, 2007 10:00 AM   Regarding: ?infection   Contact: [phone]    Casey [name] [id number], 26 year old, female, Telephone Information:  Home Phone [phone]  Work Phone [phone]      Cleotis Lema NUMBER: [phone]  Cell phone:   Other phone:    Available times:    Patient's language of care: [place]    Patient needs a [place] interpreter.    Patient's PCP: Ladora Daniel, APRN    Person calling on behalf of patient: patient (self)    Calls today with a sick call: ?vag. Infect. Related to the IUD - discharge w/ odor - pls advise.    Patient's Preferred Pharmacy:   [facility]  Phone: [phone] Fax: [phone]

## 2007-06-16 NOTE — Telephone Encounter (Signed)
TC to patient regarding ?vag. Infect, related to the IUD insertion. Having discharge w/ odor.

## 2007-06-17 ENCOUNTER — Ambulatory Visit (HOSPITAL_BASED_OUTPATIENT_CLINIC_OR_DEPARTMENT_OTHER): Payer: PRIVATE HEALTH INSURANCE

## 2007-06-17 VITALS — Temp 98.5°F

## 2007-06-17 DIAGNOSIS — N926 Irregular menstruation, unspecified: Secondary | ICD-10-CM

## 2007-06-17 DIAGNOSIS — R102 Pelvic and perineal pain unspecified side: Secondary | ICD-10-CM

## 2007-06-17 HISTORY — DX: Irregular menstruation, unspecified: N92.6

## 2007-06-17 HISTORY — DX: Pelvic and perineal pain unspecified side: R10.20

## 2007-06-17 HISTORY — DX: Pelvic and perineal pain: R10.2

## 2007-06-17 LAB — URINE PREGNANCY TEST (POINT OF CARE): HCG QUALITATIVE URINE: NEGATIVE

## 2007-06-17 MED ORDER — LO/OVRAL (28) 0.3-30 MG-MCG PO TABS
ORAL_TABLET | ORAL | Status: AC
Start: 2007-06-17 — End: 2007-12-18

## 2007-06-17 NOTE — Progress Notes (Signed)
SUBJECTIVE:  Casey [name] is a 25 year old female  C/o bad vaginal odor and pelvic pain/llq and irregular bleeding  Her bleeding stopped while on ocp and started again when finished the one pack  Pain started 1 month ago, stabbing, comes and goes, every day, lasts 5-10 minutes, several times/day  No abnormal discharge  Some dysuria "not a lot"  She is constipated    OBJECTIVE:  Vagina and vulva are normal; Menstrual blood is noted. Cervix normal without lesions. IUD string visible and IUD easily removed intact. Uterus anteverted and mobile, normal in size and shape without tenderness. Adnexa normal in size without masses or tenderness.    626.4 Irregular Menstrual Cycle (primary encounter diagnosis)  Comment:   Plan: LO/OVRAL (28) 0.3-30 MG-MCG OR TABS  Rx per orders    625.9D Pelvic Pain  Comment:   Plan: URINE CULTURE/COLONY COUNT, AMPLIFIED GENPROBE    CHLAM/GC   Add fiber, fluids, nutriteach given, fuv 1 month    V25.42Q IUD (Removal of Intrauterine Device)  Comment:   I advised pt bleeding not likely r/t IUD as resolved with OCP. She opts to remove as she wd like ocp to regulate menses.  Plan: REMOVE INTRAUTERINE DEVICE   Pt request    Schedule appt after menses for check vaginal infection

## 2007-06-17 NOTE — Progress Notes (Signed)
Due to the language barrier, the office visit was conducted in [place] with an interpreter. The interpreter's name is ellen. The interpreter was present during the entire visit.    Pt walked into clinic c/o lower left abd pain    ABNORMAL VAGINAL BLEEDING    Casey [name], 26 year old, female  Patient calls complaining of Vaginal Problem      Symptoms: small amount vaginal bleeding Now because she is taking the pill month(s).   This is not related to menstrual cycle.    If the answer to any of the following questions is YES then a provider visit is indicated unless the provider orders an alternative treatment plan.    If the Patient requests to be seen a provider visit should be scheduled.    Additional symptoms; light headed or faint with standing, watery, bright red vaginal bleeding requiring more than one sanitary pad an hour, clots, pelvic pain that is different from the usual menstrual cramping, bleeding is associated with sexual intercourse and odor especially with sexual intercourse.   Are you a recent victim of rape or trauma? no.  Are you taking Tamoxifen? no.    Date of your last menstrual period? Bleeding has been regular and everyday  Patient is on any oral contraceptives for the bleeding   If yes, have any doses been missed, particularly midcycle? no.   Patient is not post or peri menopausal.  Patient is not on any hormone replacement therapy.    TELEPHONE / HOME CARE ADVICE: pt will see pcp today

## 2007-06-17 NOTE — Telephone Encounter (Signed)
TC to patient this morning, pt stated that she is here in the clinic to be seen by a provider for her symptoms.

## 2007-06-19 LAB — URINE CULTURE/COLONY COUNT

## 2007-06-21 ENCOUNTER — Telehealth (HOSPITAL_BASED_OUTPATIENT_CLINIC_OR_DEPARTMENT_OTHER): Payer: Self-pay

## 2007-06-21 LAB — CHLAMYDIA GC NAAT
GENPROBE CHLAMYDIA: NEGATIVE
GENPROBE GC: NEGATIVE

## 2007-06-21 MED ORDER — BACTRIM DS 800-160 MG PO TABS
ORAL_TABLET | ORAL | Status: AC
Start: 2007-06-21 — End: 2007-06-24

## 2007-06-21 NOTE — Telephone Encounter (Signed)
LM for pt that I have results and sent rx to [facility] pharm, please CB to discuss.

## 2007-06-22 NOTE — Telephone Encounter (Signed)
LM for pt to CB and speak with nurse about rx as I will be out of office. She has UTI and needs antibiotic.

## 2007-06-25 DIAGNOSIS — N390 Urinary tract infection, site not specified: Secondary | ICD-10-CM | POA: Insufficient documentation

## 2007-06-25 NOTE — Telephone Encounter (Signed)
I have been unable to reach this patient by phone. I called pharmacy and she did pick up prescription for Bactrim.

## 2008-05-13 ENCOUNTER — Emergency Department (HOSPITAL_BASED_OUTPATIENT_CLINIC_OR_DEPARTMENT_OTHER)
Admission: RE | Admit: 2008-05-13 | Disposition: A | Discharge status: Home / Self Care | Payer: Self-pay | Source: Emergency Department | Attending: Emergency Medicine | Admitting: Emergency Medicine

## 2008-05-13 ENCOUNTER — Encounter (HOSPITAL_BASED_OUTPATIENT_CLINIC_OR_DEPARTMENT_OTHER): Payer: Self-pay

## 2008-05-13 LAB — BLOOD COUNT COMPLETE AUTOMATED
HEMATOCRIT: 39.9 % (ref 36.0–48.0)
HEMOGLOBIN: 13.6 g/dl (ref 12.0–16.0)
MEAN CORP HGB CONC: 34 g/dl (ref 32.0–36.0)
MEAN CORPUSCULAR HGB: 32.2 pg (ref 27.0–33.0)
MEAN CORPUSCULAR VOL: 94.6 fl (ref 80.0–100.0)
MEAN PLATELET VOLUME: 8.3 fl (ref 6.4–10.8)
PLATELET COUNT: 291 10*3/uL (ref 150–400)
RBC DISTRIBUTION WIDTH: 11.9 % (ref 11.5–14.3)
RED BLOOD CELL COUNT: 4.22 M/uL — ABNORMAL LOW (ref 4.50–5.10)
WHITE BLOOD CELL COUNT: 19 10*3/uL — ABNORMAL HIGH (ref 4.0–10.8)

## 2008-05-13 LAB — COMPREHENSIVE METABOLIC PANEL
ALANINE AMINOTRANSFERASE: 19 IU/L (ref 7–35)
ALBUMIN: 4.1 g/dl (ref 3.4–4.8)
ALKALINE PHOSPHATASE: 32 IU/L (ref 25–106)
ANION GAP: 10 mmol/L (ref 2–25)
ASPARTATE AMINOTRANSFERASE: 24 IU/L (ref 8–34)
BILIRUBIN TOTAL: 0.7 mg/dl (ref 0.2–1.1)
BUN (UREA NITROGEN): 6 mg/dl (ref 6–20)
CALCIUM: 9.9 mg/dl (ref 8.6–10.0)
CARBON DIOXIDE: 26 mmol/L (ref 22–32)
CHLORIDE: 104 mmol/L (ref 101–111)
CREATININE: 0.8 mg/dl (ref 0.4–1.2)
ESTIMATED GLOMERULAR FILT RATE: >60 mL/min (ref 60–116)
Glucose Random: 122 mg/dl (ref 74–160)
POTASSIUM: 4 mmol/L (ref 3.5–5.1)
SODIUM: 140 mmol/L (ref 135–144)
TOTAL PROTEIN: 6.7 g/dl (ref 5.9–7.5)

## 2008-05-13 LAB — DIFFERENTIAL WBC COUNT
ATYPICAL LYMPHOCYTES %: 1 % (ref 0–12)
BAND NEUTROPHILS %: 6 % (ref 0–8)
EOSINOPHILS %: 1 % (ref 0–6)
LYMPHOCYTES %: 4 % — ABNORMAL LOW (ref 13.0–39.0)
MONOCYTES %: 3 % (ref 1–12)
PLATELET ESTIMATE: NORMAL
POLYMORPHONUCLEAR (SEGS) %: 85 % — ABNORMAL HIGH (ref 46.0–79.0)
RED BLOOD CELL MORPHOLOGY: NORMAL

## 2008-05-13 LAB — LIPASE: LIPASE: 19 U/L (ref 10–50)

## 2008-05-13 LAB — US ABDOMEN COMPLETE

## 2008-05-13 LAB — EMERGENCY ROOM NOTE

## 2008-05-13 LAB — AMYLASE: AMYLASE: 81 U/L (ref 15–133)

## 2008-05-13 MED ORDER — PRILOSEC 20 MG PO CPDR
DELAYED_RELEASE_CAPSULE | ORAL | Status: AC
Start: 2008-05-13 — End: 2008-05-28

## 2008-05-13 NOTE — Discharge Instructions (Signed)
Your ultraound is normal.     Rest. Drink fluids.     Take medications as prescribed.     Follow up if not better next week. Call PCP for follow up appointment.

## 2008-05-13 NOTE — ED Notes (Signed)
Pt c/o upper abdominal pain since this am. Denies vomiting. States she had a small bm this am.

## 2008-05-15 ENCOUNTER — Encounter (HOSPITAL_BASED_OUTPATIENT_CLINIC_OR_DEPARTMENT_OTHER): Payer: Self-pay

## 2008-05-15 DIAGNOSIS — K2900 Acute gastritis without bleeding: Secondary | ICD-10-CM

## 2008-08-31 ENCOUNTER — Encounter (HOSPITAL_BASED_OUTPATIENT_CLINIC_OR_DEPARTMENT_OTHER): Payer: Self-pay

## 2008-08-31 ENCOUNTER — Ambulatory Visit (HOSPITAL_BASED_OUTPATIENT_CLINIC_OR_DEPARTMENT_OTHER): Payer: PRIVATE HEALTH INSURANCE

## 2008-08-31 VITALS — BP 140/80 | Temp 98.1°F | Wt 111.0 lb

## 2008-08-31 DIAGNOSIS — Z114 Encounter for screening for human immunodeficiency virus [HIV]: Secondary | ICD-10-CM

## 2008-08-31 DIAGNOSIS — R030 Elevated blood-pressure reading, without diagnosis of hypertension: Secondary | ICD-10-CM

## 2008-08-31 DIAGNOSIS — Z113 Encounter for screening for infections with a predominantly sexual mode of transmission: Secondary | ICD-10-CM

## 2008-08-31 DIAGNOSIS — Z124 Encounter for screening for malignant neoplasm of cervix: Secondary | ICD-10-CM

## 2008-08-31 DIAGNOSIS — Z0000 Encounter for general adult medical examination without abnormal findings: Principal | ICD-10-CM

## 2008-08-31 DIAGNOSIS — J301 Allergic rhinitis due to pollen: Secondary | ICD-10-CM

## 2008-08-31 MED ORDER — FLUTICASONE PROPIONATE (NASAL) 50 MCG/DOSE NA INHA
NASAL | Status: DC
Start: 2008-08-31 — End: 2011-04-16

## 2008-08-31 MED ORDER — OPCON-A 0.027-0.315 % OP SOLN
OPHTHALMIC | Status: AC
Start: 2008-08-31 — End: 2009-02-27

## 2008-08-31 MED ORDER — CETIRIZINE HCL 10 MG PO TABS
ORAL_TABLET | ORAL | Status: DC
Start: 2008-08-31 — End: 2011-04-16

## 2008-08-31 NOTE — Progress Notes (Signed)
SUBJECTIVE:   27 year old female for annual routine pap and checkup.  HPI:  Casey [name] is a 27 year old female who presents for evaluation and treatment of allergic symptoms for 6 months. Symptoms include nasal stuffiness, itchy eyes and sore throat. Precipitants include pets. Treatment in the past has included oral antihistamines (benadryl, claritin) and sudafed. Treatment currently includes oral antihistamines and is not effective in the patient's opinion.    Current outpatient prescriptions:  LO/OVRAL OR daily Disp:  Rfl:        Allergies: Review of patient's allergies indicates no known allergies.   Patient's last menstrual period was 08/03/2008.    ROS:  Feeling well. No dyspnea or chest pain on exertion.  No abdominal pain, change in bowel habits, black or bloody stools.  No urinary tract symptoms. GYN ROS: normal menses, no abnormal bleeding, pelvic pain or discharge and no breast pain or new or enlarging lumps on self exam.      Past Medical History    Pre-Eclampsia     Pelvic Pain 06/17/2007    Irregular Menstrual Cycle 06/17/2007         OBJECTIVE:   The patient appears well, in NAD.   BP 140/80  Temp (Src) 98.1 F (36.7 C) (Oral)  Wt 111 lb (50.349 kg)  LMP 08/03/2008  Patient appears well. Afebrile. Eyes; very mild allergic conjunctivitis noted. Nose; nasal pallor and congestion noted. Ears are normal. Throat unremarkable. Neck without significant lymphadenopathy. Chest is clear to IPPA.  S1 and S2 normal, no murmurs, regular rate and rhythm. No edema. Abdomen soft without tenderness, guarding, mass or organomegaly.    BREAST EXAM: normal without suspicious masses, skin or nipple changes or axillary nodes, symmetric fibrous changes in both upper outer quadrants, self-exam is taught and encouraged    PELVIC EXAM: EGBUS within normal limits, normal cervix without lesions, polyps or tenderness, uterus normal size, shape, consistency, no mass or tenderness, adnexa normal in size without mass or  tenderness    V70.0B Routine Medical Exam  (primary encounter diagnosis)  Comment:   Plan: ASSAY, BLD/SERUM CHOLESTEROL, ASSAY OF         LIPOPROTEIN (HDL), ASSAY OF BLOOD LIPOPROTEIN         (LDL), ROUTINE VENIPUNCTURE              V73.89E Screening for HIV (Human Immunodeficiency Virus)  Comment:   Plan: HIV 1 AND 2 PLUS O ANTIBODY, ROUTINE         VENIPUNCTURE            V76.2 Screening for Malignant Neoplasm of the Cervix  Comment:   Plan: CYTOPATH, C/V, THIN LAYER            V74.5H Screen for STD (Sexually Transmitted Disease)  Comment:   Plan: AMPLIFIED GENPROBE CHLAM/GC            796.2 Elevated Blood Pressure Reading without Diagnosis of Hypertension  Comment: reports adding a lot of salt to her food  Plan: BASIC METABOLIC PANEL        Low sodium diet; fuv 4 weeks.    477.0 Allergic Rhinitis due to Pollen  Comment:   Plan: OPCON-A 0.027-0.315 % OP SOLN, CETIRIZINE HCL         10 MG OR TABS, FLUTICASONE PROPIONATE (NASAL)         50 MCG/DOSE NA INHA          PLAN:   counseled on breast self exam, STD  prevention, HIV risk factors and prevention and family planning choices

## 2008-09-01 LAB — BASIC METABOLIC PANEL
ANION GAP: 4 mmol/L (ref 2–25)
BUN (UREA NITROGEN): 7 mg/dl (ref 6–20)
CALCIUM: 9.9 mg/dl (ref 8.6–10.3)
CARBON DIOXIDE: 29 mmol/L (ref 22–32)
CHLORIDE: 107 mmol/L (ref 101–111)
CREATININE: 0.7 mg/dl (ref 0.4–1.2)
ESTIMATED GLOMERULAR FILT RATE: >60 mL/min (ref 60–116)
Glucose Random: 81 mg/dl (ref 74–160)
POTASSIUM: 4.5 mmol/L (ref 3.5–5.1)
SODIUM: 140 mmol/L (ref 135–144)

## 2008-09-01 LAB — CHLAMYDIA GC NAAT
GENPROBE CHLAMYDIA: NEGATIVE
GENPROBE GC: NEGATIVE

## 2008-09-01 LAB — HIV 1 AND 2 PLUS O ANTIBODY: HIV 1 AND 2 PLUS O SCREEN: NONREACTIVE

## 2008-09-01 LAB — CHG LIPOPROTEIN DIRECT MEASUREMENT LDL CHOLESTEROL: LOW DENSITY LIPOPROTEIN DIRECT: 101 mg/dl — ABNORMAL HIGH (ref 0–100)

## 2008-09-01 LAB — CHOLESTEROL SERUM/WHOLE BLOOD TOTAL: Cholesterol: 163 mg/dl (ref 0–200)

## 2008-09-01 LAB — CHG LIPOPROTEIN DIR MEAS HIGH DENSITY CHOLESTEROL: HIGH DENSITY LIPOPROTEIN: 38 mg/dl (ref 35–85)

## 2008-09-05 LAB — CYTOPATH, C/V, THIN LAYER

## 2009-03-02 ENCOUNTER — Encounter (HOSPITAL_BASED_OUTPATIENT_CLINIC_OR_DEPARTMENT_OTHER): Payer: Self-pay

## 2009-03-02 ENCOUNTER — Emergency Department (HOSPITAL_BASED_OUTPATIENT_CLINIC_OR_DEPARTMENT_OTHER)
Admission: RE | Admit: 2009-03-02 | Disposition: A | Discharge status: Home / Self Care | Payer: Self-pay | Source: Emergency Department | Attending: Emergency Medicine | Admitting: Emergency Medicine

## 2009-03-02 LAB — MANUAL WBC DIFFERENTIAL
ATYPICAL LYMPHOCYTES %: 18 % — ABNORMAL HIGH (ref 0–12)
BAND NEUTROPHILS %: 2 % (ref 0–8)
EOSINOPHILS %: 1 % (ref 0–6)
LYMPHOCYTES %: 42 % — ABNORMAL HIGH (ref 13.0–39.0)
MONOCYTES %: 4 % (ref 1–12)
PLATELET ESTIMATE: NORMAL
POLYMORPHONUCLEAR (SEGS) %: 33 % — ABNORMAL LOW (ref 46.0–79.0)
RED BLOOD CELL MORPHOLOGY: NORMAL

## 2009-03-02 LAB — BLOOD COUNT COMPLETE AUTOMATED
HEMATOCRIT: 35.2 % — ABNORMAL LOW (ref 36.0–48.0)
HEMOGLOBIN: 12.3 g/dl (ref 12.0–16.0)
MEAN CORP HGB CONC: 35 g/dl (ref 32.0–36.0)
MEAN CORPUSCULAR HGB: 32.9 pg (ref 27.0–33.0)
MEAN CORPUSCULAR VOL: 94 fl (ref 80.0–100.0)
MEAN PLATELET VOLUME: 8.3 fl (ref 6.4–10.8)
PLATELET COUNT: 278 10*3/uL (ref 150–400)
RBC DISTRIBUTION WIDTH: 11.2 % — ABNORMAL LOW (ref 11.5–14.3)
RED BLOOD CELL COUNT: 3.74 M/uL — ABNORMAL LOW (ref 4.50–5.10)
WHITE BLOOD CELL COUNT: 11.7 10*3/uL — ABNORMAL HIGH (ref 4.0–10.8)

## 2009-03-02 LAB — WBC DIFFERENTIAL SCAN

## 2009-03-02 MED ORDER — OXYCODONE-ACETAMINOPHEN 5-325 MG PO TABS
ORAL_TABLET | ORAL | Status: AC
Start: 2009-03-02 — End: 2009-03-09

## 2009-03-02 NOTE — Discharge Instructions (Signed)
Please provide the following to the patient in [place]:    Your cut needs to be evaluated by a specialist.  Do not change the dressing.  Do not use your hand.    The Orthopedics appointment should call you to arrange an appointment on Monday.    Please return here immediately if your hand becomes numb or cold or if you have more bleeding or if you have any other problems.

## 2009-03-02 NOTE — ED Notes (Signed)
27yo female with laceration to Right wrist resulting in arterial blood loss.

## 2009-03-03 LAB — XR WRIST RIGHT MINIMUM 3 VIEWS

## 2009-03-05 ENCOUNTER — Ambulatory Visit (HOSPITAL_BASED_OUTPATIENT_CLINIC_OR_DEPARTMENT_OTHER): Payer: PRIVATE HEALTH INSURANCE | Admitting: Hand Surgery

## 2009-03-05 DIAGNOSIS — S61509A Unspecified open wound of unspecified wrist, initial encounter: Principal | ICD-10-CM

## 2009-03-05 MED ORDER — CEPHALEXIN 500 MG PO CAPS
ORAL_CAPSULE | ORAL | Status: AC
Start: 2009-03-05 — End: 2009-03-12

## 2009-03-05 NOTE — Patient Instructions (Signed)
This office note has been dictated. Account number [id number]

## 2009-03-05 NOTE — Progress Notes (Signed)
This office note has been dictated. Account number [id number]

## 2009-03-06 LAB — XR WRIST RIGHT MINIMUM 3 VIEWS

## 2009-03-15 LAB — ORTHOPEDIC OFFICE NOTE

## 2009-03-15 LAB — EMERGENCY ROOM NOTE

## 2009-03-19 ENCOUNTER — Ambulatory Visit (HOSPITAL_BASED_OUTPATIENT_CLINIC_OR_DEPARTMENT_OTHER): Payer: PRIVATE HEALTH INSURANCE | Admitting: Hand Surgery

## 2009-03-19 DIAGNOSIS — S61509A Unspecified open wound of unspecified wrist, initial encounter: Principal | ICD-10-CM

## 2009-03-19 NOTE — Progress Notes (Signed)
This office note has been dictated. Account number [id number]

## 2009-03-22 ENCOUNTER — Ambulatory Visit (HOSPITAL_BASED_OUTPATIENT_CLINIC_OR_DEPARTMENT_OTHER): Payer: PRIVATE HEALTH INSURANCE

## 2009-03-22 DIAGNOSIS — S61509A Unspecified open wound of unspecified wrist, initial encounter: Principal | ICD-10-CM

## 2009-03-22 NOTE — Progress Notes (Signed)
BASE EVALUATION - UPPER EXTREMITY    AGE: 28 year old  SEX: female  CHILDREN?: Yes     RELATIONSHIP: Single ETHNICITY: [place] INTERPRETER?:  Yes; [place]     LEISURE/HOBBIES: none stated at this time     REFERRING PROVIDER: Debra A. Olam Idler, MD  [address]  [place], [place] [address]     DIAGNOSIS/REASON FOR REFERRAL: R wrist laceration      Hx OF PRESENT ILLNESS: Reports that on 03/02/09 she was attempting to close a window when her hand went through the glass; reported immediately to the ED; reports she was sutured in the ED; lacerated radial artery; seen by Dr. Olam Idler on 03/05/09 & 03/19/09 referred to OT 2 pain, edema and loss of ROM.    MEDICAL Hx: Denies   SURGICAL Hx: c section; tonsilectomy   SOCIAL HISTORY/HOME SITUATION: Patient lives w/ family   DOES PATIENT FEEL SAFE AT HOME: Yes LEARNS BEST: all   PAIN AT WORST: 3/10 PAIN AT BEST: 0/10 AVERAGE: 3/10   PAIN DESCRIPTION: Intermittent throbbing pain to R wrist only   LOCATION OF PAIN (Use Images activity for visual diagram):  L wrist    SKIN INTEGRITY/COLOR: OTHER: well approximated incision to radial aspect of volar wrist extendes from mid-thenar region across [facility] measuring 4.5cm; 2.0cm laceraction 1.5" proximal to distal aspect of primary laceration.   Minimal erythema extending 1" radially from primary laceration.    SENSATION: WNL? Yes reports paraesthesia at the time of injury; has now resolved      SUTURES: OTHER: sutures removed prior to OT appointment  MEASUREMENTS: see above      DOMINANCE: right     EDEMA: Figure 8: R 37.0cm L 35.9cm       Please Note: Only populated fields were assessed by provider, fields left blank were not assessed.    GIRTH RIGHT LEFT GIRTH RIGHT LEFT GIRTH RIGHT LEFT   WRIST 14.5 14.0 MP CIRCUMF. 17.1 16.8 ELBOWS       SPECIAL TEST RIGHT LEFT SPECIAL TEST RIGHT LEFT SPECIAL TEST RIGHT LEFT   FINKELSTEINS   PHALENS   LONG FINGER     TINELS TEST             STRENGTH AND ROM:  RIGHT RIGHT LEFT LEFT COMMENTS    ROM MMT  ROM MMT    SHOULD FLEX 0-180         EXT 0-50         ABDUCTION 0-160         INTERNAL ROTATION 0-70         EXTERNAL ROTATION 0-90        ELBOW FLEX/EXT 0-145        FOREAR PRONATION 0-85 0-78        SUPINATION 0-85 0-78       WRIST FLEX 0-70 0-40        EXT 0-70 0-42        ULNAR DEVIATION 0-30 0-28        RADIAL DEVIATION 0-20 0-6         ROM:  RIGHT RIGHT LEFT LEFT COMMENTS    ROM MMT ROM MMT    THUMB CM FLEX 0-15         CM EXT 0-20         MP FLEX/EXT 0-50 0-50        IP FLEX/EXT 0-80 0-55        ABDUCTION 0-70 0-44  OPPOSITION (CM) full    Decreased spped/accuracy    D2 MP FLEX 0-90 0-80        PIP FLEX/EXT 0-100 0-90        DIP FLEX/EXT 0-90 0-60       D3 MP FLEX 0-90 0-82        PIP FLEX/EXT 0-100 0-90        DIP FLEX/EXT 0-90 0-62       D4 MP FLEX 0-90 0-74        PIP FLEX/EXT 0-100 0-98        DIP FLEX/EXT 0-90 0-60       D5 MP FLEX 0-90 0-72        PIP FLEX/EXT 0-100 0-94        DIP FLEX/EXT 0-90 0-65       GRIP GRIP 20#  58#     PINCH LATERAL 8#  15#      TRIPOD 7#  17#      TIP 5#  9#                FUNCTIONAL STATUS  COMMENTS     FEEDING INDEPENDENT Using L UE   PERSONAL CARE IMPAIRED    DRESSING IMPAIRED    LIFTING DEPENDANT    HOUSEHOLD CHORES DEPENDANT      03/22/2009 OT initial evaluation completed, refer to assessment for details.  Issued and reviewed HEP, patient with good understanding. Education provided regarding injury/diagnosis and course of OT treatment.     Occupational Therapy Plan of Care    ZO:XWRUE Dewitt Hoes, APRN  Referring Provider: Michela Pitcher, MD  Diagnosis: [phone] Open Wound of Wrist with Complication  (primary encounter diagnosis)    Assessment/Objective Findings: Patient is a 28 y.o RHD female referred to OT s/p R wrist laceration with artery involvement. Refer to initial OT assessment for complete objective findings. Patient presents with pain, edema, decreased ROM, decreased strength, and decreased functional use of her R hand since injury. Patient will benefit from  skilled OT to address the rehabilitation goals outlined below.    Rehabilitation Goals  Patient to be Independent with Home Exercise Program.  Duration: 2 weeks  Average pain will be reduced from a 3/10 to a 0/10. Duration: 4 weeks  Pt. will ^ R wrist flexion/extension by 25-30 to ^ ease of ADL's. Duration: 4 Weeks   Pt. will ^ grip strength R hand by 20-30# to ^ ease of work tasks. Duration: 4 Weeks  Pt. will ^ R thumb IP flexion by 20-25 to ^ ease of functional grasp. Duration: 4 weeks  Pt will decrease R hand edema by 0.5-0.8cm to ^ ease of ROM. Duration: 4 weeks     Long Term Goal: Return to prior level of function without limitations. Duration: 6-8 weeks     Treatment Plan:   ** Stretching/ROM Exercise  ** Therapeutic Exercise  ** Home Exercise Program  ** Soft Tissue Mobilization  ** Ultrasound  ** Hot/Cold Rx  ** Functional Activities  ** Patient Education  ** Scar Management    Recommend Occupational Therapy be continued 2 times per week for 6 weeks.  The rehabilitation potential for this patient is good    Patient is agreeable to treatment plan and is aware of attendance policy.    Doran Durand, OTR/L

## 2009-03-22 NOTE — Patient Instructions (Addendum)
Rehabilitation Treatment Flowsheet    Precautions:    Date: 03/22/2009          Initials: kb         Visit #: 1         Time: 60         HEP AROM, scar mobs         Treatment(s)          OT Eval/ Re-eval          R hand/wrist MHP          R wrist A/AAROM          Gentle PROM R wrist          Tendon Glides             A/AAROM R thumb          Ultrasound 3 MHz 0.8w/cm2 cont. To scar          Scar Mobs            Edema Manangment          Wrist PRE's            Gross Grasp            Grip/Pinch

## 2009-03-23 ENCOUNTER — Encounter (HOSPITAL_BASED_OUTPATIENT_CLINIC_OR_DEPARTMENT_OTHER): Payer: Self-pay

## 2009-03-23 NOTE — Progress Notes (Addendum)
I certify that the documented Treatment Plan is reasonable and necessary.    03/23/2009  Ka-Yi Leontine Radman, PA-C

## 2009-03-27 ENCOUNTER — Ambulatory Visit (HOSPITAL_BASED_OUTPATIENT_CLINIC_OR_DEPARTMENT_OTHER): Payer: PRIVATE HEALTH INSURANCE

## 2009-03-27 DIAGNOSIS — S61509A Unspecified open wound of unspecified wrist, initial encounter: Principal | ICD-10-CM

## 2009-03-27 NOTE — Patient Instructions (Addendum)
Rehabilitation Treatment Flowsheet    Precautions:    Date: 03/22/2009  03/27/2009         Initials: kb kb        Visit #: 1 2        Time: 60 30        HEP AROM, scar mobs         Treatment(s)  2/10        OT Eval/ Re-eval x         R hand/wrist MHP x x        R wrist A/AAROM  x        Gentle PROM R wrist  x        Tendon Glides     x        A/AAROM R thumb  x        Ultrasound 3 MHz 0.8w/cm2 cont. To scar          Scar Mobs    x        Edema Manangment  AROM         Wrist PRE's            Gross Grasp            Grip/Pinch

## 2009-03-27 NOTE — Progress Notes (Signed)
S: "I only have pain when I move it like this"  O: Refer to Rehabilitation Treatment Flowsheet; c/o pain with wrist extension & palpation of volar radial wrist; 52 wrist extension    A: Noted to have significant decrease in edema to the R hand with initiation of HEP; pain and decreased wrist extension 2 extrinsic flexor tightness and guarding of AROM 2 anticipation of pain.   P: Continue OT, per POC

## 2009-03-29 ENCOUNTER — Ambulatory Visit (HOSPITAL_BASED_OUTPATIENT_CLINIC_OR_DEPARTMENT_OTHER): Payer: PRIVATE HEALTH INSURANCE

## 2009-03-29 DIAGNOSIS — S61509A Unspecified open wound of unspecified wrist, initial encounter: Principal | ICD-10-CM

## 2009-03-29 NOTE — Progress Notes (Signed)
S: "I'm doing more with it"  O: Refer to Rehabilitation Treatment Flowsheet; 50 wrist extension; 56 wrist flexion; reporting mild pain with wrist flexion/extension only.   A: Progressing well with currentl POC; pain with ROM 2 scarring to volar radial wrist.   P: Continue OT, per POC

## 2009-03-29 NOTE — Patient Instructions (Addendum)
Rehabilitation Treatment Flowsheet    Precautions:    Date: 03/22/2009  03/27/2009  03/29/2009        Initials: kb kb kb       Visit #: 1 2 3        Time: 60 30        HEP AROM, scar mobs         Treatment(s)  2/10 2/10 with wrist AROM only       OT Eval/ Re-eval x         R hand/wrist MHP x x x       R wrist A/AAROM  x x       Gentle PROM R wrist  x x       Tendon Glides     x x       A/AAROM R thumb  x x       Ultrasound 3 MHz 0.8w/cm2 cont. To scar          Scar Mobs    x x       Edema Manangment  AROM  Retrograde massage       Wrist PRE's            Gross Grasp            Grip/Pinch      Xx-soft putty

## 2009-04-03 ENCOUNTER — Ambulatory Visit (HOSPITAL_BASED_OUTPATIENT_CLINIC_OR_DEPARTMENT_OTHER): Payer: PRIVATE HEALTH INSURANCE

## 2009-04-03 DIAGNOSIS — S61509A Unspecified open wound of unspecified wrist, initial encounter: Principal | ICD-10-CM

## 2009-04-03 NOTE — Patient Instructions (Addendum)
Rehabilitation Treatment Flowsheet    Precautions:    Date: 03/22/2009  03/27/2009  03/29/2009  04/03/2009       Initials: kb kb kb kb      Visit #: 1 2 3 4       Time: 60 30  40      HEP AROM, scar mobs         Treatment(s)  2/10 2/10 with wrist AROM only 0/10      OT Eval/ Re-eval x         R hand/wrist MHP x x x x      R wrist A/AAROM  x x x      Gentle PROM R wrist  x x x      Tendon Glides     x x       A/AAROM R thumb  x x       Ultrasound 3 MHz 0.8w/cm2 cont. To scar    x      Scar Mobs    x x x      Edema Manangment  AROM  Retrograde massage       Wrist PRE's      2# 2x10      Gross Grasp            Grip/Pinch      Xx-soft putty  x

## 2009-04-03 NOTE — Progress Notes (Signed)
S: "Do you know what this is?"  O: Refer to Rehabilitation Treatment Flowsheet; mild skin irritation to proximal scar; no raised appearance or signs of allergic reaction; patient reports mild discomfort with use of coban to hold on silicone scar pad; denies itching. Instructed to discontinue use of coban and trial neoprene wrap to secure scar pad. Wrist extension 56   A: Progressing well towards LTG's; decreased pain reported with wrist extension.   P: Continue OT, per POC                 .

## 2009-04-05 ENCOUNTER — Ambulatory Visit (HOSPITAL_BASED_OUTPATIENT_CLINIC_OR_DEPARTMENT_OTHER): Payer: PRIVATE HEALTH INSURANCE

## 2009-04-05 DIAGNOSIS — S61509A Unspecified open wound of unspecified wrist, initial encounter: Secondary | ICD-10-CM

## 2009-04-05 NOTE — Patient Instructions (Addendum)
Rehabilitation Treatment Flowsheet    Precautions:    Date: 03/22/2009  03/27/2009  03/29/2009  04/03/2009  04/05/2009      Initials: kb kb kb kb kb     Visit #: 1 2 3 4 5      Time: 60 30  40 40     HEP AROM, scar mobs         Treatment(s)  2/10 2/10 with wrist AROM only 0/10 0/10     OT Eval/ Re-eval x         R hand/wrist MHP x x x x x     R wrist A/AAROM  x x x x     Gentle PROM R wrist  x x x x     Tendon Glides     x x       A/AAROM R thumb  x x       Ultrasound 3 MHz 0.8w/cm2 cont. To scar    x x     Scar Mobs    x x x x     Edema Manangment  AROM  Retrograde massage  2# forearm PRE's 2x10     Wrist PRE's      2# 2x10 x     Gross Grasp       25# 2x10     Grip/Pinch      Xx-soft putty  x HEP

## 2009-04-05 NOTE — Progress Notes (Signed)
S: "It's feeling ok"  O: Refer to Rehabilitation Treatment Flowsheet; distal scar with slightly raised/thickened appearance; will continue to monitor   A: Progressing well towards LTG's; patient reports she is returning to work today. Decreased pain response with wrist extension noted today.   P: Continue OT, per POC

## 2009-04-17 ENCOUNTER — Ambulatory Visit (HOSPITAL_BASED_OUTPATIENT_CLINIC_OR_DEPARTMENT_OTHER): Payer: PRIVATE HEALTH INSURANCE

## 2009-04-17 DIAGNOSIS — S61509A Unspecified open wound of unspecified wrist, initial encounter: Secondary | ICD-10-CM

## 2009-04-17 NOTE — Progress Notes (Signed)
S: "It feels 90%, I still have trouble lifting things"  O: Refer to Rehabilitation Treatment Flowsheet; development of hypertropic scar to to the R wrist distal scar. Discontinued ultrasound treatment and issued elastomer scar pad. Patient instructed to increase wearing time. Wrist AROM 66/68.    A: Progressing well with return of functional use of the R hand without pain; Demonstrating decreased extrinsic flexor tightness evident by improve wrist extension. Development of hypertropic scar does not appear to be limiting progress. Will continue to monitor.   P: Continue OT, per POC    .

## 2009-04-17 NOTE — Patient Instructions (Addendum)
Rehabilitation Treatment Flowsheet    Precautions:    Date: 03/22/2009  03/27/2009  03/29/2009  04/03/2009  04/05/2009  04/17/2009     Initials: kb kb kb kb kb kb    Visit #: 1 2 3 4 5 6     Time: 60 30  40 40 35    HEP AROM, scar mobs         Treatment(s)  2/10 2/10 with wrist AROM only 0/10 0/10 0/10    OT Eval/ Re-eval x         R hand/wrist MHP x x x x x x    R wrist A/AAROM  x x x x     Gentle PROM R wrist  x x x x x    Tendon Glides     x x       A/AAROM R thumb  x x   Issued elastomer scar pad    Ultrasound 3 MHz 0.8w/cm2 cont. To scar    x x Held 2 development of raised scar    Scar Mobs    x x x x x    Edema Manangment  AROM  Retrograde massage  2# forearm PRE's 2x10 2# 3x20    Wrist PRE's      2# 2x10 x 2# 3x20    Gross Grasp       25# 2x10 35# 2x20    Grip/Pinch      Xx-soft putty  x HEP Issued red putty

## 2009-04-19 ENCOUNTER — Ambulatory Visit (HOSPITAL_BASED_OUTPATIENT_CLINIC_OR_DEPARTMENT_OTHER): Payer: PRIVATE HEALTH INSURANCE

## 2009-04-19 DIAGNOSIS — S61509A Unspecified open wound of unspecified wrist, initial encounter: Principal | ICD-10-CM

## 2009-04-19 NOTE — Progress Notes (Signed)
S: "I feel ok today"  O: Refer to Rehabilitation Treatment Flowsheet  A: Improved mobility of scar tissue noted; reporting compliance with use of elastomer scar pad. Making good progress towards goals.   P: Continue OT, per POC; plan to transition to HEP next week

## 2009-04-19 NOTE — Patient Instructions (Addendum)
Rehabilitation Treatment Flowsheet    Precautions:    Date: 03/22/2009  03/27/2009  03/29/2009  04/03/2009  04/05/2009  04/17/2009  04/19/2009    Initials: kb kb kb kb kb kb kb   Visit #: 1 2 3 4 5 6 7    Time: 60 30  40 40 35 35   HEP AROM, scar mobs         Treatment(s)  2/10 2/10 with wrist AROM only 0/10 0/10 0/10 0/10   OT Eval/ Re-eval x         R hand/wrist MHP x x x x x x x   R wrist A/AAROM  x x x x     Gentle PROM R wrist  x x x x x x   Tendon Glides     x x       A/AAROM R thumb  x x   Issued elastomer scar pad    Ultrasound 3 MHz 0.8w/cm2 cont. To scar    x x Held 2 development of raised scar    Scar Mobs    x x x x x x   Edema Manangment  AROM  Retrograde massage  2# forearm PRE's 2x10 2# 3x20 3#   Wrist PRE's      2# 2x10 x 2# 3x20 x   Gross Grasp       25# 2x10 35# 2x20 x   Grip/Pinch      Xx-soft putty  x HEP Issued red putty  Weightbearing into soft putty; push/pull with dowel

## 2009-04-20 LAB — ORTHOPEDIC OFFICE NOTE

## 2009-04-24 ENCOUNTER — Ambulatory Visit (HOSPITAL_BASED_OUTPATIENT_CLINIC_OR_DEPARTMENT_OTHER): Payer: PRIVATE HEALTH INSURANCE

## 2009-04-24 DIAGNOSIS — S61509A Unspecified open wound of unspecified wrist, initial encounter: Secondary | ICD-10-CM

## 2009-04-24 NOTE — Patient Instructions (Signed)
Rehabilitation Treatment Flowsheet    Precautions:    Date: 03/22/2009  03/27/2009  03/29/2009  04/03/2009  04/05/2009  04/17/2009  04/19/2009    Initials: kb kb kb kb kb kb kb   Visit #: 1 2 3 4 5 6 7    Time: 60 30  40 40 35 35   HEP AROM, scar mobs         Treatment(s)  2/10 2/10 with wrist AROM only 0/10 0/10 0/10 0/10   OT Eval/ Re-eval x         R hand/wrist MHP x x x x x x x   R wrist A/AAROM  x x x x     Gentle PROM R wrist  x x x x x x   Tendon Glides     x x       A/AAROM R thumb  x x   Issued elastomer scar pad    Ultrasound 3 MHz 0.8w/cm2 cont. To scar    x x Held 2 development of raised scar    Scar Mobs    x x x x x x   Edema Manangment  AROM  Retrograde massage  2# forearm PRE's 2x10 2# 3x20 3#   Wrist PRE's      2# 2x10 x 2# 3x20 x   Gross Grasp       25# 2x10 35# 2x20 x   Grip/Pinch      Xx-soft putty  x HEP Issued red putty  Weightbearing into soft putty; push/pull with dowel      Rehabilitation Treatment Flowsheet    Precautions:    Date: 04/24/2009          Initials: kb         Visit #: 8         Time: 40         HEP          Treatment(s) 0/10         R wrist MHP   x         R long flex/ext streteches x         Scar mobs   x         Wrist PRE's   5# 2x20         Forearm PRE's   5# 2x20         Gross Grasp   35# 2x20         Weightbearing   Soft putty         Push/pull into putty with dowel x

## 2009-04-24 NOTE — Progress Notes (Signed)
S: "the color isn't always the same as here"  O: Refer to Rehabilitation Treatment Flowsheet; reporting occasional discoloration to skin surrounding site of laceration; denies associated pain or paraesthesia.   A: Patient describing sx of a mild venous congestion at site of wrist laceration. Instruction given regarding injury and symptoms that would require medical attention in the future. Upon inspection there is no skin abnormality or discoloration; normal capilary refill to all digits.  Has made excellent gains towards LTG.   P: plan to transition to HEP only after next visit.

## 2009-04-26 ENCOUNTER — Ambulatory Visit (HOSPITAL_BASED_OUTPATIENT_CLINIC_OR_DEPARTMENT_OTHER): Payer: PRIVATE HEALTH INSURANCE

## 2009-04-26 DIAGNOSIS — S61509A Unspecified open wound of unspecified wrist, initial encounter: Principal | ICD-10-CM

## 2009-04-26 NOTE — Patient Instructions (Signed)
Rehabilitation Treatment Flowsheet    Precautions:    Date: 03/22/2009  03/27/2009  03/29/2009  04/03/2009  04/05/2009  04/17/2009  04/19/2009    Initials: kb kb kb kb kb kb kb   Visit #: 1 2 3 4 5 6 7    Time: 60 30  40 40 35 35   HEP AROM, scar mobs         Treatment(s)  2/10 2/10 with wrist AROM only 0/10 0/10 0/10 0/10   OT Eval/ Re-eval x         R hand/wrist MHP x x x x x x x   R wrist A/AAROM  x x x x     Gentle PROM R wrist  x x x x x x   Tendon Glides     x x       A/AAROM R thumb  x x   Issued elastomer scar pad    Ultrasound 3 MHz 0.8w/cm2 cont. To scar    x x Held 2 development of raised scar    Scar Mobs    x x x x x x   Edema Manangment  AROM  Retrograde massage  2# forearm PRE's 2x10 2# 3x20 3#   Wrist PRE's      2# 2x10 x 2# 3x20 x   Gross Grasp       25# 2x10 35# 2x20 x   Grip/Pinch      Xx-soft putty  x HEP Issued red putty  Weightbearing into soft putty; push/pull with dowel      Rehabilitation Treatment Flowsheet    Precautions:    Date: 04/24/2009  04/26/2009         Initials: kb kb        Visit #: 8 9        Time: 40 40        HEP          Treatment(s) 0/10 0/10        R wrist MHP   x x        R long flex/ext streteches x x        Scar mobs   x x        Wrist PRE's   5# 2x20 x        Forearm PRE's   5# 2x20 x        Gross Grasp   35# 2x20 x        Weightbearing   Soft putty x        Push/pull into putty with dowel x x                D/c assessment

## 2009-04-26 NOTE — Progress Notes (Signed)
.  OCCUPATIONAL THERAPY  DISCHARGE REPORT    DIAGNOSIS: R wrist laceration with radial artery involvement  MD: Michela Pitcher, MD    Your patient, Casey [name], has been discharged from Occupational Therapy after a total of 9 visits.    REASON(S) FOR DISCHARGE:    This patient has: Achieved maximal benefit from OT; has returned to pre-injury functional status     RECOMMENDATIONS:    This patient should: Resume all activities without limitations; continue with HEP     COMMENTS: wrist flexion/extension 68/64; UD/RD 36/18; digits WNL; thumb WNL; grip strength R 45# L 65#; Lateral/Tripod/Tip Pinch R 14/14/10# L 15/15/13#.       If you have any questions please feel free to contact the department at [facility]  [place], [place] .    Therapist: Doran Durand, OTR/L

## 2009-12-18 ENCOUNTER — Encounter (HOSPITAL_BASED_OUTPATIENT_CLINIC_OR_DEPARTMENT_OTHER): Payer: Self-pay

## 2009-12-18 ENCOUNTER — Emergency Department (HOSPITAL_BASED_OUTPATIENT_CLINIC_OR_DEPARTMENT_OTHER)
Admission: RE | Admit: 2009-12-18 | Disposition: A | Discharge status: Home / Self Care | Payer: Self-pay | Source: Emergency Department | Attending: Emergency Medicine | Admitting: Emergency Medicine

## 2009-12-18 LAB — XR CHEST 2 VIEWS

## 2009-12-18 LAB — HOLD RED TOP TUBE

## 2009-12-18 LAB — HOLD GREEN TOP TUBE

## 2009-12-18 LAB — D-DIMER PE/DVT, QUANTITATIVE: D-DIMER PE/DVT, QUANTITATIVE: <0.19 mg/L FEU (ref 0.00–0.49)

## 2009-12-18 LAB — EKG

## 2009-12-18 LAB — HOLD PURPLE TOP TUBE

## 2009-12-18 MED ORDER — IBUPROFEN 600 MG PO TABS
ORAL_TABLET | ORAL | Status: AC
Start: 2009-12-18 — End: 2010-01-17

## 2009-12-18 NOTE — Discharge Instructions (Signed)
Your tests today are normal.     Follow up if not better next week.

## 2009-12-18 NOTE — ED Notes (Signed)
pT C/O BURNING CHEST PAIN SINCE LAST NIGHT.

## 2009-12-19 LAB — EMERGENCY ROOM NOTE

## 2009-12-20 ENCOUNTER — Encounter (HOSPITAL_BASED_OUTPATIENT_CLINIC_OR_DEPARTMENT_OTHER): Payer: Self-pay

## 2009-12-20 DIAGNOSIS — R0789 Other chest pain: Secondary | ICD-10-CM | POA: Insufficient documentation

## 2010-04-02 ENCOUNTER — Encounter (HOSPITAL_BASED_OUTPATIENT_CLINIC_OR_DEPARTMENT_OTHER): Payer: Self-pay

## 2010-04-02 ENCOUNTER — Emergency Department (HOSPITAL_BASED_OUTPATIENT_CLINIC_OR_DEPARTMENT_OTHER)
Admission: RE | Admit: 2010-04-02 | Disposition: A | Discharge status: Home / Self Care | Payer: Self-pay | Source: Emergency Department | Attending: Emergency Medicine | Admitting: Emergency Medicine

## 2010-04-02 LAB — CBC, PLATELET & DIFFERENTIAL
BASOPHIL %: 0.3 % (ref 0.0–2.0)
EOSINOPHIL %: 2.1 % (ref 0.0–7.0)
HEMATOCRIT: 39 % (ref 36.0–48.0)
HEMOGLOBIN: 13.4 g/dl (ref 12.0–16.0)
LYMPHOCYTE %: 26.2 % (ref 13.0–39.0)
MEAN CORP HGB CONC: 34.3 g/dl (ref 32.0–36.0)
MEAN CORPUSCULAR HGB: 32.4 pg (ref 27.0–33.0)
MEAN CORPUSCULAR VOL: 94.4 fl (ref 80.0–100.0)
MEAN PLATELET VOLUME: 8.9 fl (ref 6.4–10.8)
MONOCYTE %: 4.5 % (ref 1.0–12.0)
NEUTROPHIL %: 66.9 % (ref 46.0–79.0)
PLATELET COUNT: 282 10*3/uL (ref 150–400)
RBC DISTRIBUTION WIDTH: 11.8 % (ref 11.5–14.3)
RED BLOOD CELL COUNT: 4.13 M/uL — ABNORMAL LOW (ref 4.50–5.10)
WHITE BLOOD CELL COUNT: 10.8 10*3/uL (ref 4.0–10.8)

## 2010-04-02 LAB — APTT: APTT: 28.9 SECONDS (ref 23.7–33.3)

## 2010-04-02 LAB — BASIC METABOLIC PANEL
ANION GAP: 9 mmol/L (ref 2–25)
BUN (UREA NITROGEN): 5 mg/dl — ABNORMAL LOW (ref 6–20)
CALCIUM: 9.4 mg/dl (ref 8.6–10.3)
CARBON DIOXIDE: 22 mmol/L (ref 22–32)
CHLORIDE: 105 mmol/L (ref 101–111)
CREATININE: 0.6 mg/dl (ref 0.4–1.2)
ESTIMATED GLOMERULAR FILT RATE: >60 mL/min (ref >60)
Glucose Random: 89 mg/dl (ref 74–160)
POTASSIUM: 4.4 mmol/L (ref 3.5–5.1)
SODIUM: 136 mmol/L (ref 135–144)

## 2010-04-02 LAB — PROTHROMBIN TIME
INR: 0.9 — ABNORMAL LOW (ref 2.0–3.5)
PROTHROMBIN TIME: 9.5 SECONDS (ref 9.5–11.5)

## 2010-04-02 LAB — D-DIMER PE/DVT, QUANTITATIVE: D-DIMER PE/DVT, QUANTITATIVE: 0.32 mg/L FEU (ref 0.00–0.49)

## 2010-04-02 MED ORDER — GUAIFENESIN-CODEINE 100-10 MG/5ML PO SOLN
5.00 mL | Freq: Three times a day (TID) | ORAL | Status: AC | PRN
Start: 2010-04-02 — End: 2010-05-02

## 2010-04-02 MED ORDER — IBUPROFEN 600 MG PO TABS
600.0000 mg | ORAL_TABLET | Freq: Three times a day (TID) | ORAL | Status: AC | PRN
Start: 2010-04-02 — End: 2010-05-02

## 2010-04-02 MED ORDER — AZITHROMYCIN 250 MG PO TABS
250.00 mg | ORAL_TABLET | Freq: Every day | ORAL | Status: AC
Start: 2010-04-03 — End: 2010-04-07

## 2010-04-02 NOTE — Discharge Instructions (Signed)
Bronquite  (Bronchitis)     A bronquite é uma inflamação (a forma que o corpo tem de reagir a um dano e/ou uma infecção) dos brônquios. Os brônquios são os tubos de ar que levam ar aos pulmões. Se a inflamação se tornar séria, pode causar falta de ar.        CAUSAS  A inflamação pode ser causada por:  Ø Vírus.  Ø Germes (bactérias).  Ø Poeira.  Ø Alérgenos.  Ø Poluentes e vários outros fatores irritantes.      As células que revestem a árvore brônquica são cobertas de cílios. Esses cílios se movimentam constantemente dos pulmões em direção à boca. Isso mantêm o pulmão livre de poluição. Quando essas células se irritam e não podem desempenhar sua função, começa a secreção de muco. Isso causa a tosse que caracteriza a bronquite. A tosse limpa os pulmões quando os cílios não podem realizar sua função. Sem estes mecanismos de defesa, o material ficaria alojado nos pulmões desenvolvendo, dessa maneira, pneumonia.      O tabagismo é uma causa comum da bronquite e pode contribuir à aparição de pneumonia. Parar de fumar é uma das maneiras mais importantes de autoajuda.     TRATAMENTO  Ø Seu médico pode receitar um antibiótico, se sua tosse for causada por bactéria, e medicamentos que abrirão as vias respiratórias para facilitar a respiração. Ele também pode recomendar ou receitar algum expectorante que liberará o catarro por meio da tosse. Tome somente os medicamentos vendidos com e sem receita médica para dores, desconforto ou febre segundo as orientações de seu médico.   Ø Eliminar a provável causa do problema (tabagismo, por exemplo) é essencial para prevenir sua complicação.  Ø Os supressores de tosse podem ser prescritos para aliviar os sintomas da tosse.  Ø Medicamentos por inalação podem ser também prescritos para ajudar a aliviar os sintomas e evitar a reaparição dos problemas.  Ø Para aquelas pessoas que sofrem de bronquite crônica, talvez seja necessário o uso de medicamentos que contêm esteróides.     PROCURE UM  MÉDICO IMEDIATAMENTE SE:  Ø Aparecer, durante o tratamento, escarros purulentos.  Ø Tiver febre que não puder ser controlada por medicamentos.  Ø A doença piorar gradativamente.   Ø Sentir dificuldade crescente para respirar, chiado no peito ou falta de ar.     É necessário buscar ajuda médica imediata se você for idoso ou sofrer de alguma outra doença.     ASSEGURE-SE DE QUE:   Ø Entendeu essas instruções.   Ø Controlará sua condição.  Ø Buscará ajuda imediata caso não se sinta bem ou seu estado piore.     Document Released: 12/01/2005  Document Re-Released: 02/27/2009  ExitCare® Patient Information ©2010 [employer].

## 2010-04-05 LAB — EMERGENCY ROOM NOTE

## 2010-04-29 LAB — XR CHEST 2 VIEWS

## 2011-04-16 ENCOUNTER — Encounter (HOSPITAL_BASED_OUTPATIENT_CLINIC_OR_DEPARTMENT_OTHER): Payer: Self-pay | Admitting: Family Medicine

## 2011-04-16 ENCOUNTER — Ambulatory Visit (HOSPITAL_BASED_OUTPATIENT_CLINIC_OR_DEPARTMENT_OTHER): Payer: PRIVATE HEALTH INSURANCE | Admitting: Family Medicine

## 2011-04-16 VITALS — BP 158/110 | Ht <= 58 in | Wt 111.0 lb

## 2011-04-16 DIAGNOSIS — Z01419 Encounter for gynecological examination (general) (routine) without abnormal findings: Secondary | ICD-10-CM

## 2011-04-16 DIAGNOSIS — Z124 Encounter for screening for malignant neoplasm of cervix: Secondary | ICD-10-CM

## 2011-04-16 DIAGNOSIS — J301 Allergic rhinitis due to pollen: Secondary | ICD-10-CM

## 2011-04-16 DIAGNOSIS — R200 Anesthesia of skin: Secondary | ICD-10-CM

## 2011-04-16 DIAGNOSIS — S93409A Sprain of unspecified ligament of unspecified ankle, initial encounter: Secondary | ICD-10-CM

## 2011-04-16 DIAGNOSIS — I10 Essential (primary) hypertension: Secondary | ICD-10-CM

## 2011-04-16 LAB — COMPREHENSIVE METABOLIC PANEL
ALANINE AMINOTRANSFERASE: 17 IU/L (ref 7–35)
ALBUMIN: 4.3 g/dl (ref 3.4–4.8)
ALKALINE PHOSPHATASE: 38 IU/L (ref 25–106)
ANION GAP: 7 mmol/L (ref 2–25)
ASPARTATE AMINOTRANSFERASE: 18 IU/L (ref 8–34)
BILIRUBIN TOTAL: 0.4 mg/dl (ref 0.2–1.1)
BUN (UREA NITROGEN): 4 mg/dl — ABNORMAL LOW (ref 6–20)
CALCIUM: 9.9 mg/dl (ref 8.6–10.3)
CARBON DIOXIDE: 27 mmol/L (ref 22–32)
CHLORIDE: 106 mmol/L (ref 101–111)
CREATININE: 0.8 mg/dl (ref 0.4–1.2)
ESTIMATED GLOMERULAR FILT RATE: >60 mL/min (ref >60)
Glucose Random: 68 mg/dl — ABNORMAL LOW (ref 74–160)
POTASSIUM: 4.2 mmol/L (ref 3.5–5.1)
SODIUM: 140 mmol/L (ref 135–144)
TOTAL PROTEIN: 7.7 g/dl — ABNORMAL HIGH (ref 5.9–7.5)

## 2011-04-16 LAB — CHG LIPOPROTEIN DIR MEAS HIGH DENSITY CHOLESTEROL: HIGH DENSITY LIPOPROTEIN: 40 mg/dl (ref 35–85)

## 2011-04-16 LAB — CHOLESTEROL: Cholesterol: 160 mg/dl (ref 0–200)

## 2011-04-16 LAB — CHG LIPOPROTEIN DIRECT MEASUREMENT LDL CHOLESTEROL: LOW DENSITY LIPOPROTEIN DIRECT: 104 mg/dl — ABNORMAL HIGH (ref 0–100)

## 2011-04-16 MED ORDER — CETIRIZINE HCL 10 MG PO TABS
10.0000 mg | ORAL_TABLET | Freq: Every day | ORAL | Status: AC
Start: 2011-04-16 — End: 2011-10-13

## 2011-04-16 MED ORDER — HYDROCHLOROTHIAZIDE 25 MG PO TABS
12.50 mg | ORAL_TABLET | Freq: Every day | ORAL | Status: AC
Start: 2011-04-16 — End: 2011-07-17

## 2011-04-16 MED ORDER — FLUTICASONE PROPIONATE 50 MCG/ACT NA SUSP
1.00 | Freq: Every day | NASAL | Status: AC
Start: 2011-04-16 — End: 2011-05-17

## 2011-04-21 LAB — CYTOPATH, C/V, THIN LAYER

## 2011-10-05 ENCOUNTER — Emergency Department (HOSPITAL_BASED_OUTPATIENT_CLINIC_OR_DEPARTMENT_OTHER)
Admission: RE | Admit: 2011-10-05 | Disposition: A | Discharge status: Home / Self Care | Payer: Self-pay | Source: Emergency Department | Attending: Emergency Medicine | Admitting: Emergency Medicine

## 2011-10-05 ENCOUNTER — Encounter (HOSPITAL_BASED_OUTPATIENT_CLINIC_OR_DEPARTMENT_OTHER): Payer: Self-pay | Admitting: Emergency Medicine

## 2011-10-05 HISTORY — DX: Essential (primary) hypertension: I10

## 2011-10-05 LAB — XR CHEST 2 VIEWS

## 2011-10-05 MED ORDER — AZITHROMYCIN 250 MG PO TABS
250.00 mg | ORAL_TABLET | ORAL | Status: AC
Start: 2011-10-05 — End: 2011-10-10

## 2011-10-05 MED ORDER — IPRATROPIUM-ALBUTEROL 0.5-2.5 (3) MG/3ML IN SOLN
3.00 mL | Freq: Once | RESPIRATORY_TRACT | Status: AC
Start: 2011-10-05 — End: 2011-10-05
  Administered 2011-10-05: 3 mL via RESPIRATORY_TRACT
  Filled 2011-10-05: qty 3

## 2011-10-05 MED ORDER — TO-GO ALBUTEROL
Status: AC
Start: 2011-10-05 — End: 2011-10-05
  Administered 2011-10-05: 15:00:00 via RESPIRATORY_TRACT
  Filled 2011-10-05: qty 1

## 2011-10-05 MED ORDER — ALBUTEROL SULFATE HFA 108 (90 BASE) MCG/ACT IN AERS
2.0000 | INHALATION_SPRAY | RESPIRATORY_TRACT | Status: DC
Start: 2011-10-05 — End: 2014-03-02

## 2011-10-05 NOTE — Discharge Instructions (Signed)
Your xray showed a possible mild pneumonia, so you have been started on antibiotics.  Also, you have been started on an inhaler to help your coughing/wheezing.  This is the same as the breathing treatments you got here.  Use it every four hours while sick and then talk to your doctor about whether you need another prescription.  Rest and drink plenty of fluids.  Use ibuprofen or tylenol for fevers if needed.  Follow up with your doctor this week.    RETURN TO ER for high fever, trouble breathing, or for any other concerns.

## 2011-10-05 NOTE — ED Notes (Signed)
PHONE CALL TO RESPIRATORY FOR DUONEB - EN ROUTE

## 2011-10-05 NOTE — ED Notes (Signed)
Pt states she feels better after medication.  Respiratory therapist performed pt teaching and demonstration of inhaler use.  At time of discharge pt c/o palpitations and mild tremors noted.  Will continue to observe before discharging home.

## 2011-10-05 NOTE — ED Notes (Signed)
Patient Disposition: discharge    Patient education for diagnosis (pneumonia, asthma, smoker) , medications (zithromax, albuterol), activity (rest, quit smoking), diet (increase fluids) and follow-up (PCP this week).  Patient left ED 2:56 PM.  Patient received written instructions.  Interpreter to provide instructions: pt declined  Discharged to: home

## 2011-10-05 NOTE — ED Notes (Signed)
MDI teachings and treatment done. 3 puffs of Albuterol via spacer. Patient did good on third try.

## 2011-10-05 NOTE — ED Notes (Signed)
DUO NEB IN PROGRESS W/ RT - PT COUGHING THROUGHOUT TX

## 2011-10-05 NOTE — ED Provider Notes (Signed)
I have reviewed the ED nursing notes and prior records. I have reviewed the patient's past medical history/problem list, allergies, social history and medication list.  I saw this patient primarily.    HPI:  This is a 30 year old female patient presenting with a chief complaint of productive cough with yellow sputum for the past 4-5 days, subjective fevers at home, none today, no blood in the sputum, no chest pain, does feel mildly short of breath, also has nasal congestion, mild sore throat, no nausea vomiting, no abdominal pain.      ROS: Pertinent positives were reviewed as per the HPI above. All other systems were reviewed and are negative.    Past Medical History/Problem List:    Past Medical History    Pre-eclampsia     Pelvic pain 06/17/2007    Irregular menstrual cycle 06/17/2007    HTN (hypertension)        Patient Active Problem List:     CONTRACEPTIVE MANAGEMENT [V25.9B]     SPEECH/LANGUAGE DISORDER [315.3]     UTI [599.0W]     Acute Gastritis [535.00E]     Allergic Rhinitis due to Pollen [477.0]     Open Wound of Wrist with Complication [881.12A]     Atypical chest pain [786.59AC]     Hypertension goal BP (blood pressure) < 140/90 [401.9BS]      Past Surgical History:    Past Surgical History    UNLIS PX PHARYNX ADENOIDS/TONSILS     Comment age  40    OB ANTEPARTUM CARE C DLVR&POSTPARTUM     Comment age 34    TONSILLECTOMY 1/2 AGE 57/>          Medications:     No current facility-administered medications on file prior to encounter.  Current outpatient prescriptions ordered prior to encounter:  hydrochlorothiazide (HYDRODIURIL) 12.5 MG tablet Take 25 mg by mouth daily. Disp:  Rfl:    albuterol (PROAIR HFA) 108 (90 BASE) MCG/ACT inhaler Inhale 2 puffs into the lungs See Admin Instructions. 1-2 puffs every 6 hours while awake on as scheduled basis for 3 days, and thereafter as needed. Always use together with aerochamber spacing device. Disp: 1 Inhaler Rfl: 0   fluticasone (FLONASE) 50 MCG/ACT nasal spray 1  spray by Each Nostril route daily. Disp: 1 Bottle Rfl: 2   cetirizine (ZYRTEC) 10 MG tablet Take 1 tablet by mouth daily. for allergies Disp: 30 tablet Rfl: 5   diphenhydrAMINE (BENADRYL) 25 MG TABS None Entered Disp:  Rfl:    TYLENOL ARTHRITIS PAIN OR None Entered Disp:  Rfl:    LO/OVRAL OR daily Disp:  Rfl:          Social History:     Smoking status: Current Everyday Smoker     Types: Cigarettes    Smokeless tobacco:     Comment:     Alcohol Use: Yes    Comment: special occasions       Allergies:  Review of Patient's Allergies indicates:  No Known Allergies    Physical Exam:  BP 153/83  Pulse 106  Temp 98.2 F  Resp 22  Wt 72.576 kg  SpO2 100%  PF 410 L/min  LMP 09/05/2011  Pulse ox is 100% on room air, which is normal  GEN: No acute distress, appears comfortable  HEENT: Atraumatic, normocephalic.  PERRL, EOMI.  Conjunctiva clear.  Oropharynx normal without exudate, with mild erythema.  Neck supple and without lymphadenopathy.  CARDIOVASC: Regular rate and rhythm, no murmurs,  rubs, and gallops.  No peripheral edema.  RESP: No respiratory distress, good air exchange, bilateral inspiratory and expiratory wheezes with rhonchi in the bases  ABD: Soft, nontender, nondistended, nl bowel sounds. No organomegaly noted.  NEURO: Alert and oriented, CN III-XII grossly nl, MAEx4  PSYCH: Appropriate affect and thought  SKIN: Warm, dry, no rashes or bruising     ED Course and Medical Decision-making:    The patient is a 30 year old female with productive cough and fevers for 4 days, her exam is consistent with reactive airway disease, so will try DuoNeb and reassess. -->  After this DuoNeb, the patient reports that she feels better, although she still wheezing, so will give a second DuoNeb. -->  After second DuoNeb, she reports that she feels even better, on exam she still has a very faint expiratory wheeze bilaterally, and she does have some rhonchi in the right lower lobe that doesn't clear with coughing.  In light of  these findings, will get a chest x-ray.     --> Two-view chest x-ray interpreted by me in advance of radiology shows a possible right middle lobe infiltrate, so the patient will be started on azithromycin to cover for this pneumonia, additionally she will be given an inhaler and spacer with teaching here, and a prescription for at home.  She was counseled repeatedly that she should try to stop smoking as this is hurting her lungs.  She was advised she needs to followup with her primary care doctor to discuss her symptoms, and she agrees to do this.  She agrees to return for any worsening.      Disposition:  Discharged to home    Condition on discharge:  Improved and Stable    Diagnosis/Diagnoses:  486F Pneumonia  493.90Y Reactive airway disease  305.1AL Smoker    Lorenso Courier MD

## 2011-10-05 NOTE — ED Notes (Signed)
PT HR = 122 AFTER INHALER TEACHING, PT OBSERVED FOR - DISCHARGE PENDING

## 2011-10-05 NOTE — ED Triage Note (Signed)
C/O COUGH X 1 WEEK WITH NASAL CONGESTION

## 2011-10-06 ENCOUNTER — Telehealth (HOSPITAL_BASED_OUTPATIENT_CLINIC_OR_DEPARTMENT_OTHER): Payer: Self-pay | Admitting: Registered Nurse

## 2011-10-06 NOTE — Telephone Encounter (Signed)
Called pt  For ED follow up r/t   Pneumonia    No answer  Message left on VM to call the clinic for a f/u appt  And/or any questions or concerns

## 2011-10-08 ENCOUNTER — Encounter (HOSPITAL_BASED_OUTPATIENT_CLINIC_OR_DEPARTMENT_OTHER): Payer: Self-pay | Admitting: Family Medicine

## 2011-10-08 ENCOUNTER — Ambulatory Visit (HOSPITAL_BASED_OUTPATIENT_CLINIC_OR_DEPARTMENT_OTHER): Payer: PRIVATE HEALTH INSURANCE | Admitting: Family Medicine

## 2011-10-08 VITALS — BP 122/70 | HR 79 | Temp 97.4°F | Ht <= 58 in | Wt 114.0 lb

## 2011-10-08 DIAGNOSIS — R059 Cough, unspecified: Secondary | ICD-10-CM

## 2011-10-08 DIAGNOSIS — R05 Cough: Principal | ICD-10-CM

## 2011-10-08 NOTE — Progress Notes (Signed)
Patient is seen with the assistance of the portugese interpruter throughout the history and physical.    Patient is here to follow up.  She feels better but not fully.    She says that her cough is better but not 100%.    She says that she is using her inhaler every 4 hours.    She does not smoke - she quit this past week.  She says that it is going well.  She had been smoking 2-3 cigs per day.    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      She says that she is on the pill and the last week she feels body aches and she feels better when her period comes. She takes a pill from [place].  She is considering getting the IUD. She says that her periods are light.        Patient's medical, surgical, social and family history reviewed and updated.    PE: BP 122/70  Pulse 79  Temp 97.4 F (36.3 C)  Ht 4\' 10"  (1.473 m)  Wt 114 lb (51.71 kg)  BMI 23.83 kg/m2  SpO2 100%  LMP 09/05/2011  Gen: pleasant woman NAD  HEENT: OMM OP without erythema TM's normal bilaterally  Neck: supple no LAN no thyromegally  Lungs: good air movement end inspiratory wheezing bilaterally in front cleared with cough  Heart: RRR no murmur    Xray reviewed from er - no infiltrate seen    A/P: 30 year old woman  1. Cough: improving could be more related to post viral reactive airway disease and the smoking - she has since stopped smoking. She will continue to use the inhaler every 4 hours.  She will call if her symptoms worsen or with any other concerns.    2. Family planning: she will speak with them about the IUD - she has had the paraguard in the past and did not like this.     3. RHM: cpe with pap up to date

## 2012-03-19 ENCOUNTER — Encounter (HOSPITAL_BASED_OUTPATIENT_CLINIC_OR_DEPARTMENT_OTHER): Payer: Self-pay

## 2012-03-19 ENCOUNTER — Emergency Department (HOSPITAL_BASED_OUTPATIENT_CLINIC_OR_DEPARTMENT_OTHER)
Admission: RE | Admit: 2012-03-19 | Disposition: A | Discharge status: Home / Self Care | Payer: Self-pay | Source: Emergency Department | Attending: Emergency Medicine | Admitting: Emergency Medicine

## 2012-03-19 LAB — HOLD GREEN TOP TUBE

## 2012-03-19 LAB — CBC WITH PLATELET
HEMATOCRIT: 40.3 % (ref 34.1–44.9)
HEMOGLOBIN: 13.8 g/dL (ref 11.2–15.7)
MEAN CORP HGB CONC: 34.2 g/dL (ref 31.0–37.0)
MEAN CORPUSCULAR HGB: 31.9 pg (ref 26.0–34.0)
MEAN CORPUSCULAR VOL: 93.3 fL (ref 80.0–100.0)
MEAN PLATELET VOLUME: 11.1 fL (ref 8.7–12.5)
PLATELET COUNT: 204 10*3/uL (ref 150–400)
RBC DISTRIBUTION WIDTH STD DEV: 39.8 fL (ref 35.1–46.3)
RBC DISTRIBUTION WIDTH: 12 % (ref 11.5–14.3)
RED BLOOD CELL COUNT: 4.32 M/uL (ref 3.90–5.20)
WHITE BLOOD CELL COUNT: 3.6 10*3/uL — ABNORMAL LOW (ref 4.0–11.0)

## 2012-03-19 LAB — BASIC METABOLIC PANEL
ANION GAP: 5 mmol/L (ref 3–11)
BUN (UREA NITROGEN): 7 mg/dl (ref 6–20)
CALCIUM: 9.2 mg/dl (ref 8.6–10.3)
CARBON DIOXIDE: 24 mmol/L (ref 22–32)
CHLORIDE: 102 mmol/L (ref 101–111)
CREATININE: 1 mg/dl (ref 0.4–1.2)
ESTIMATED GLOMERULAR FILT RATE: >60 mL/min (ref >60)
Glucose Random: 141 mg/dl (ref 74–160)
POTASSIUM: 3.9 mmol/L (ref 3.5–5.1)
SODIUM: 131 mmol/L — ABNORMAL LOW (ref 135–144)

## 2012-03-19 LAB — XR CHEST 2 VIEWS

## 2012-03-19 LAB — THYROID SCREEN TSH REFLEX FT4: THYROID SCREEN TSH REFLEX FT4: 0.83 u[IU]/mL (ref 0.34–5.60)

## 2012-03-19 MED ORDER — ACETAMINOPHEN 325 MG PO TABS
975.00 mg | ORAL_TABLET | Freq: Once | ORAL | Status: AC
Start: 2012-03-19 — End: 2012-03-19
  Administered 2012-03-19: 975 mg via ORAL
  Filled 2012-03-19: qty 3

## 2012-03-19 MED ORDER — IBUPROFEN 400 MG PO TABS
400.00 mg | ORAL_TABLET | Freq: Once | ORAL | Status: AC
Start: 2012-03-19 — End: 2012-03-19
  Administered 2012-03-19: 400 mg via ORAL
  Filled 2012-03-19: qty 1

## 2012-03-19 MED ORDER — PSEUDOEPHEDRINE HCL 30 MG PO TABS
30.00 mg | ORAL_TABLET | Freq: Four times a day (QID) | ORAL | Status: AC | PRN
Start: 2012-03-19 — End: 2012-03-22

## 2012-03-19 MED ORDER — SODIUM CHLORIDE 0.9 % IV BOLUS
1000.00 mL | Freq: Once | INTRAVENOUS | Status: AC
Start: 2012-03-19 — End: 2012-03-19
  Administered 2012-03-19: 1000 mL via INTRAVENOUS

## 2012-03-19 MED ORDER — BENZONATATE 100 MG PO CAPS
100.00 mg | ORAL_CAPSULE | Freq: Three times a day (TID) | ORAL | Status: AC | PRN
Start: 2012-03-19 — End: 2012-04-18

## 2012-03-19 NOTE — ED Notes (Signed)
Patient Disposition    Patient education for diagnosis, medications, activity, diet and follow-up.  Patient left ED 12:05 PM.  Patient rep received written instructions.  Interpreter to provide instructions: {yes   Discharged to: home

## 2012-03-19 NOTE — ED Notes (Signed)
Discharge instructions review   with pt

## 2012-03-19 NOTE — ED Triage Note (Signed)
Cough fever body aches x three days took tylenol cold and cough last PM

## 2012-03-19 NOTE — ED Provider Notes (Signed)
Emergency Department Note by ED attg Alphonzo Grieve, MD, 03/19/2012, 1:12 PM      Chief Complaint: Patient presents with:    Cough - COUGH & HEADACHE    Headache      HPI: This is a 31 year old female with a couple days of runny nose, sore throat, cough, headache, subjective fever, and malaise. She had laryngeal surgery as a child and has a unique voice, but no acute change and no respiratory complaints.     Review of systems: all other systems reviewed and negative except as above.   Medications:   No current facility-administered medications on file prior to encounter.  Current outpatient prescriptions ordered prior to encounter:  diphenhydrAMINE (BENADRYL) 25 MG TABS None Entered Disp:  Rfl:    TYLENOL ARTHRITIS PAIN OR None Entered Disp:  Rfl:    LO/OVRAL OR daily Disp:  Rfl:    hydrochlorothiazide (HYDRODIURIL) 12.5 MG tablet Take 25 mg by mouth daily. Disp:  Rfl:    albuterol (PROAIR HFA) 108 (90 BASE) MCG/ACT inhaler Inhale 2 puffs into the lungs See Admin Instructions. 1-2 puffs every 6 hours while awake on as scheduled basis for 3 days, and thereafter as needed. Always use together with aerochamber spacing device. Disp: 1 Inhaler Rfl: 0   fluticasone (FLONASE) 50 MCG/ACT nasal spray 1 spray by Each Nostril route daily. Disp: 1 Bottle Rfl: 2       Review of Patient's Allergies indicates:  No Known Allergies    Past Medical History    Pre-eclampsia     Pelvic pain 06/17/2007    Irregular menstrual cycle 06/17/2007    HTN (hypertension)          Past Surgical History    UNLISTED PROCEDURE PHARYNX ADENOIDS/TONSILS     Comment age  89    OB ANTEPARTUM CARE CESAREAN DLVR & POSTPARTUM     Comment age 53    TONSILLECTOMY ONE-HALF AGE 18/>          Family History    In Good Health Mother     In Good Health Father     Cancer - Other FamHxNeg     Diabetes FamHxNeg     Heart FamHxNeg          Physical exam:   03/19/12  0936 03/19/12  1002 03/19/12  1042 03/19/12  1137   BP: 138/98  112/76    Pulse: 135 122 110 93   Temp:  97.6 F   98.1 F   Resp: 18 18 18 20    Weight: 49.896 kg      SpO2: 100% 100% 100%        Alert, oriented, spontaneous, and appropriate, in no distress. Muffled high-pitched voice c/w history. Absent uvula. No evidence of head trauma. No scleral icterus. EOMI. Oropharynx clear without erythema. Neck supple and nontender without lymphadenopathy and with good ROM. Chest clear with normal excursion. Heart regular without audible murmurs or rubs. Abdomen soft and non-tender with no masses/organomegally. Skin and extremities normal with no cyanosis, clubbing, edema, rashes. Neurologic examination normal -- strength and sensation normal. Psychiatric examination normal -- mood and affect normal.    Labs: Results for orders placed during the hospital encounter of 03/19/12 (from the past 24 hour(s))   CBC + PLT    Collection Time    03/19/12  9:45 AM   Component Value Comment    WHITE BLOOD CELL 3.6 (*)     RED BLOOD CELL COUNT 4.32  HEMOGLOBIN 13.8      HEMATOCRIT 40.3      MEAN CORPUSCULAR VOL 93.3      MEAN CORPUSCULAR HGB 31.9      MEAN CORP HGB CONC 34.2      RBC DISTRIBUTION WIDTH STD 39.8      RBC DISTRIBUTION WIDTH 12.0      PLATELET COUNT 204      MEAN PLATELET VOLUME 11.1     BASIC METABOLIC PANEL    Collection Time    03/19/12  9:45 AM   Component Value Comment    SODIUM 131 (*)     POTASSIUM 3.9      CHLORIDE 102      CARBON DIOXIDE 24      ANION GAP 5      CALCIUM 9.2      Glucose Random 141      BLOOD UREA NITROGEN 7      CREATININE 1.0      ESTIMATED GLOMERULAR FILT RATE > 60     HOLD GREEN TOPE TUBE    Collection Time    03/19/12  9:45 AM   Component Value Comment    HOLD GREEN TOP RECEIVED IN CHEM       Radiographic studies: Negative chest xray.    Impression: Viral syndrome. Initial tachycardia due to dehydration.     Plan: symptomatic care, discharge, RTED instructions.

## 2012-03-22 LAB — EKG

## 2012-04-15 ENCOUNTER — Ambulatory Visit (HOSPITAL_BASED_OUTPATIENT_CLINIC_OR_DEPARTMENT_OTHER): Payer: PRIVATE HEALTH INSURANCE | Admitting: Family Medicine

## 2012-04-15 ENCOUNTER — Encounter (HOSPITAL_BASED_OUTPATIENT_CLINIC_OR_DEPARTMENT_OTHER): Payer: Self-pay | Admitting: Family Medicine

## 2012-04-15 VITALS — BP 120/80 | Wt 104.0 lb

## 2012-04-15 DIAGNOSIS — Z30430 Encounter for insertion of intrauterine contraceptive device: Principal | ICD-10-CM

## 2012-04-15 DIAGNOSIS — Z975 Presence of (intrauterine) contraceptive device: Secondary | ICD-10-CM

## 2012-04-15 LAB — URINE PREGNANCY TEST (POINT OF CARE): HCG QUALITATIVE URINE: NEGATIVE

## 2012-04-15 MED ORDER — LEVONORGESTREL 20 MCG/24HR IU IUD
INTRAUTERINE_SYSTEM | INTRAUTERINE | Status: AC
Start: 2012-04-15 — End: 2013-04-15

## 2012-04-15 MED ORDER — IBUPROFEN 400 MG PO TABS
400.00 mg | ORAL_TABLET | Freq: Four times a day (QID) | ORAL | Status: AC | PRN
Start: 2012-04-15 — End: 2012-04-16

## 2012-04-15 NOTE — Progress Notes (Signed)
SUBJECTIVE  31 y/o F here to discuss contraception options. Interested in [place] IUD. She has been on the pill but doesn't like how it makes her feel. She stopped using pill last month. Last intercourse was 1 week ago. Doesn't remember last LMP. She feels like she would be more at risk for getting pregnant in next month if not inserted today and would take chance of terminating very early pregnancy as hcg NEGATIVE today. She is not ready for a child currently.     PATIENT/PROCEDURE VERIFICATION DOCUMENTATION    Correct patient: Yes  Correct procedure: Yes  Correct side, site, mark visible if applicable: Yes  Correct position: Yes  Special equipment/implant(s) present, if applicable: N/A    Time-out completed, documented by provider doing procedure or designated team member:  Vic Ripper, MD    04/15/2012    36:56 PM74 year old female is here for an IUD insertion. Reason(s) for IUD: contraception. Currently she is sexually active, monogamous and denies risk for pregnancy. She denies current symptoms of possible pelvic infection, recent pelvic infection and pregnancy. Her last unprotected sexual encounter was less than two weeks ago. Patient's last menstrual period was 02/24/2012. Labs completed include: negative pregnancy test.    Obstetric History   G1  P1  T0  P0  TAB0  SAB0  E0  M0  L0     Patient has verbalized understanding of risks and benefits and has signed the consent form. Current statistical risk of pregnancy with IUD is 0.3-1%. Current statistical risk of ectopic pregnancy with IUD is low but if pregnancy occurs ectopic risk is about 35%.    EXAM:  Uterus: anteverted, normal size and non-tender.    PROCEDURE:  IUD type: Mirena. Cervix prepped with betadine. Tenaculum applied to anterior lip of the cervix. Uterus Sounds to 7.0 cm. IUD inserted with minor difficulties due to cervical stenosis. Strings trimmed.    POST PAIN ASSESSMENT:  Post pain assessment done. Patient rates pain as a 4 on a  0-10 pain scale. Estimated blood loss: 1 cc.    Patient given 400mg  ibuprofen for pain     DISPOSITION AND FOLLOW UP:  Instructions given to patient regarding checking IUD strings, anticipated vaginal bleeding and warning signs. Instructed to call for any concerns. Follow up appointment in 6 weeks.

## 2012-05-13 ENCOUNTER — Ambulatory Visit (HOSPITAL_BASED_OUTPATIENT_CLINIC_OR_DEPARTMENT_OTHER): Payer: PRIVATE HEALTH INSURANCE | Admitting: Family Medicine

## 2012-06-09 ENCOUNTER — Ambulatory Visit (HOSPITAL_BASED_OUTPATIENT_CLINIC_OR_DEPARTMENT_OTHER): Payer: PRIVATE HEALTH INSURANCE | Admitting: Family Medicine

## 2012-06-09 VITALS — BP 110/70 | Ht <= 58 in | Wt 101.0 lb

## 2012-06-09 DIAGNOSIS — Z30431 Encounter for routine checking of intrauterine contraceptive device: Principal | ICD-10-CM

## 2012-06-09 NOTE — Progress Notes (Signed)
SUBJECTIVE  31 y/o F here for IUD check. Doing well with this. Happy with it. Gets occasional sharp pains but this is tolerable. Not much bleeding. No pain with intercourse and husband doesn't feel strings.     BP 110/70  Ht 4\' 10"  (1.473 m)  Wt 101 lb (45.813 kg)  BMI 21.11 kg/m2  LMP 05/12/2012  GEN: The patient appears well, in NAD.  NEURO: A/O x 3,  Sensation grossly intact  Pelvic: Vulva reveals no erythema, lesions or atrophic changes. Vagina normal with normal rugation, no lesions, polyps or discharge. Cervix appears normal without lesions. IUD strings visible from os.       ASSESSMENT/PLAN:  IUD in place. Reminded patient that IUD prevents against pregnancy but not against STDs so condoms are needed for any new or questionable partners.   --F/u as needed    A total of 15 minutes of counseling was spent with this patient.

## 2012-09-07 ENCOUNTER — Ambulatory Visit (HOSPITAL_BASED_OUTPATIENT_CLINIC_OR_DEPARTMENT_OTHER): Payer: PRIVATE HEALTH INSURANCE | Admitting: Family Medicine

## 2012-09-07 VITALS — BP 140/80 | Ht <= 58 in | Wt 103.0 lb

## 2012-09-07 DIAGNOSIS — R222 Localized swelling, mass and lump, trunk: Principal | ICD-10-CM

## 2012-09-07 DIAGNOSIS — Z124 Encounter for screening for malignant neoplasm of cervix: Secondary | ICD-10-CM

## 2012-09-07 DIAGNOSIS — R5381 Other malaise: Secondary | ICD-10-CM

## 2012-09-07 DIAGNOSIS — R5383 Other fatigue: Secondary | ICD-10-CM

## 2012-09-07 LAB — CBC WITH PLATELET
HEMATOCRIT: 38.8 % (ref 34.1–44.9)
HEMOGLOBIN: 13.3 g/dL (ref 11.2–15.7)
MEAN CORP HGB CONC: 34.3 g/dL (ref 31.0–37.0)
MEAN CORPUSCULAR HGB: 32.6 pg (ref 26.0–34.0)
MEAN CORPUSCULAR VOL: 95.1 fL (ref 80.0–100.0)
MEAN PLATELET VOLUME: 10.9 fL (ref 8.7–12.5)
PLATELET COUNT: 303 10*3/uL (ref 150–400)
RBC DISTRIBUTION WIDTH STD DEV: 44.4 fL (ref 35.1–46.3)
RBC DISTRIBUTION WIDTH: 12.6 % (ref 11.5–14.3)
RED BLOOD CELL COUNT: 4.08 M/uL (ref 3.90–5.20)
WHITE BLOOD CELL COUNT: 4.8 10*3/uL (ref 4.0–11.0)

## 2012-09-07 NOTE — Progress Notes (Signed)
SUBJECTIVE  31 y/o F here for several issues  1) weight loss: has been walking for exercise; was 116# at her max last year and now down to 103#. Eating healthy but does eat some junk foods such as [employer].     2) lump on L chest wall: noted it 1 week ago. She has gone to ED in past for chest pain but this is different. Doesn't hurt. No breast cancer in family. No bleeding from nipple. No dimpling of skin or changes of skin above lump.     3) fatigue: feeling like she sleeps well, but that she wants to sleep more. Goes to bed at 11pm then wakes at 7:45 (9 hrs). Doesn't nap but if she stays home, she would sleep. Feeling slow at her job.     BP 140/80  Ht 4\' 10"  (1.473 m)  Wt 103 lb (46.72 kg)  BMI 21.53 kg/m2  GEN: The patient appears well, in NAD.  NEURO: A/O x 3,  Sensation grossly intact  ENT:  Neck supple. No adenopathy or thyromegaly.    LUNGS: Clear, good air entry, no wheezes, rhonci or rales.   HEART: S1 and S2 normal, no murmurs, regular rate and rhythm.   BREASTS: + fibrocystic changes lateral sides b/l. + soft mobile rubbery masss about 2 cm size superior to L breast at location that she is concerned about. No significant axillary LN  Pelvic: Vulva reveals no erythema, lesions or atrophic changes. Vagina normal with normal rugation, no lesions, polyps or discharge. Cervix appears normal without lesions.     ASSESSMENT/PLAN:  31 yo F here for several issues  1) weight loss- intentional. Commended patient about this, but counseled about decreasing unhealthy foods  2) lump chest wall: feels like a benign lump but will get an u.s to better delineate. No red flags. No LN, no family hx breast cancer  --U/S ordered  3) fatigue/malaise  --check CBC, TSH, vit D  4) pap: need for pap and HPV  --done today. If normal, repeat in 5 yrs

## 2012-09-08 LAB — VITAMIN D,25 HYDROXY: VITAMIN D,25 HYDROXY: 30.3 ng/ml (ref 30.0–100.0)

## 2012-09-08 LAB — THYROID SCREEN TSH REFLEX FT4: THYROID SCREEN TSH REFLEX FT4: 1.28 u[IU]/mL (ref 0.34–5.60)

## 2012-09-09 LAB — HUMAN PAPILLOMAVIRUS (HPV): HUMAN PAPILLOMAVIRUS: NEGATIVE

## 2012-09-10 LAB — CYTOPATH, C/V, THIN LAYER

## 2012-09-16 ENCOUNTER — Ambulatory Visit (HOSPITAL_BASED_OUTPATIENT_CLINIC_OR_DEPARTMENT_OTHER): Payer: Self-pay | Admitting: Family Medicine

## 2012-09-16 LAB — US BREAST-AXILLA LEFT

## 2012-09-16 LAB — MA DIAGNOSTIC MAMMO BILATERAL WITH CAD

## 2013-09-14 ENCOUNTER — Ambulatory Visit (HOSPITAL_BASED_OUTPATIENT_CLINIC_OR_DEPARTMENT_OTHER): Payer: PRIVATE HEALTH INSURANCE | Admitting: Family Medicine

## 2014-03-02 ENCOUNTER — Encounter (HOSPITAL_BASED_OUTPATIENT_CLINIC_OR_DEPARTMENT_OTHER): Payer: Self-pay | Admitting: Family Medicine

## 2014-03-02 ENCOUNTER — Ambulatory Visit (HOSPITAL_BASED_OUTPATIENT_CLINIC_OR_DEPARTMENT_OTHER): Payer: PRIVATE HEALTH INSURANCE | Admitting: Family Medicine

## 2014-03-02 VITALS — BP 142/80 | HR 92 | Temp 98.5°F | Ht <= 58 in | Wt 102.0 lb

## 2014-03-02 DIAGNOSIS — N644 Mastodynia: Secondary | ICD-10-CM

## 2014-03-02 DIAGNOSIS — Z30431 Encounter for routine checking of intrauterine contraceptive device: Secondary | ICD-10-CM

## 2014-03-02 DIAGNOSIS — Z01419 Encounter for gynecological examination (general) (routine) without abnormal findings: Secondary | ICD-10-CM

## 2014-03-02 MED ORDER — ALBUTEROL SULFATE HFA 108 (90 BASE) MCG/ACT IN AERS
2.0000 | INHALATION_SPRAY | RESPIRATORY_TRACT | Status: DC
Start: 2014-03-02 — End: 2014-03-16

## 2014-03-02 NOTE — Progress Notes (Signed)
SUBJECTIVE:   33 year old female here for annual checkup. Requesting IUD check  1) feeling a lump in her L breast. Had a mammogram/ultrasound last year which was unrevealing. She notes continued pain in the area of the lump.     IMPRESSION:   1. No mammographic evidence of malignancy in either breast.   2. No mammographic or sonographic abnormality in the area of clinical   concern in the left breast. In the absence of a mammographic and   sonographic abnormality, further treatment and evaluation should be   based on clinical assessment.     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 Albuterol helps a lot.   3) IUD: + occsional stabbing pain during intercourse. No bleeding.         Social History   Marital Status: Single  Spouse Name: N/A    Years of Education: N/A  Number of Children: 1     Occupational History  housecleaning       Social History Main Topics   Smoking status: Current Some Day Smoker     Types: Cigarettes    Last Attempt to Quit: 09/28/2011    Smokeless tobacco: Former Neurosurgeon    Comment: 2 cigs/day    Alcohol Use: Yes    Comment: special occasions    Drug Use: No    Sexual Activity: Yes    Partners: Male    Pharmacist, hospital Protection: IUD     Other Topics Concern    Caffeine Concern No    Comment: 1 cup coffee/day    Sleep Concern No    Stress Concern No    Exercise Yes    Self-Exams Yes     Social History Narrative    12/02/2006  lives with partner of  7 yrs, 1 son- Benjie Karvonen born 2002        5/12: living with her boyfriend and her 36 y/o son. Occasional working in house cleaning        3/15: living with BS. Son in [place] . House cleaning          Current Outpatient Prescriptions on File Prior to Visit:  levonorgestrel (MIRENA) 20 MCG/24HR IUD by Intrauterine route. As Instructed Disp: 1 Device Rfl: 0   [DISCONTINUED] albuterol (PROAIR HFA) 108 (90 BASE) MCG/ACT inhaler Inhale 2 puffs into the lungs See Admin Instructions. 1-2 puffs every 6 hours while awake on as  scheduled basis for 3 days, and thereafter as needed. Always use together with aerochamber spacing device. Disp: 1 Inhaler Rfl: 0     No current facility-administered medications on file prior to visit.      Review of Patient's Allergies indicates:   Cat dander              Shortness of Breath    Comment:Improved with albuterol.      ROS:  Feeling well. No dyspnea or chest pain on exertion.  No abdominal pain, change in bowel habits, black or bloody stools. No weakness, HA or vision changes.  No weight changes. No urinary tract symptoms. GYN ROS: normal menses, no abnormal bleeding, pelvic pain or discharge, no breast pain or new or enlarging lumps on self exam  OBJECTIVE:   BP 142/80   Pulse 92   Temp(Src) 98.5 F (36.9 C)   Ht 4\' 10"  (1.473 m)   Wt 102 lb (46.267 kg)   BMI 21.32 kg/m2   SpO2 100%  GEN: The patient appears well, in NAD  NEURO: A/O x 3,  DTRs intact and symmetric  ENT:  Neck supple. No adenopathy or thyromegaly.  LUNGS: Clear, good air entry, no wheezes, rhonci or rales.   HEART: S1 and S2 normal, no murmurs. Regular rate and rhythm. No edema.   ABD: Abdomen soft without tenderness, guarding, mass or organomegaly.  BREAST: No suspicious masses, skin or nipple changes or axillary nodes. + rubbery lump just superior to L breast at 12 oclock (where pt was concerned)   PELVIC:Normal vagina and vulva, normal cervix without lesions, polyps or tenderness. Uterus normal size, shape, consistency, no mass or tenderness, adnexa normal in size without mass or tenderness. IUD strings NOT visible from os. Anus and perineum appear normal  MUSC: 5/5 upper and lower extremity strength    ASSESSMENT/PLAN: Well woman. Allergies and medications reviewed.   1) preventive care  --pap smear:  Due 2018  --labwork:none   --hep B risk: low   --health care proxy: previously done   --counseled about diet, encouraged adequate calcium/vit D intake    2) lump superior to L breast. This had been noted 1 yr ago but not seen on  mammo or u/s. ? Fibroadenoma  --ref to breast center for futher eval    3) IUD surveillance, c/o migration   --will get pelvic us to confirm position. It had previoulsy been seen on exam 1 mo  following insertion

## 2014-03-14 ENCOUNTER — Ambulatory Visit (HOSPITAL_BASED_OUTPATIENT_CLINIC_OR_DEPARTMENT_OTHER): Payer: Self-pay | Admitting: Family Medicine

## 2014-03-14 ENCOUNTER — Telehealth (HOSPITAL_BASED_OUTPATIENT_CLINIC_OR_DEPARTMENT_OTHER): Payer: Self-pay | Admitting: Family Medicine

## 2014-03-14 ENCOUNTER — Ambulatory Visit (HOSPITAL_BASED_OUTPATIENT_CLINIC_OR_DEPARTMENT_OTHER): Payer: PRIVATE HEALTH INSURANCE | Admitting: Nurse Practitioner

## 2014-03-14 VITALS — BP 90/64 | Temp 98.3°F

## 2014-03-14 DIAGNOSIS — N644 Mastodynia: Secondary | ICD-10-CM

## 2014-03-14 DIAGNOSIS — Z30431 Encounter for routine checking of intrauterine contraceptive device: Principal | ICD-10-CM

## 2014-03-14 DIAGNOSIS — N63 Unspecified lump in unspecified breast: Secondary | ICD-10-CM

## 2014-03-14 LAB — US PELVIC NON-PREGNANT

## 2014-03-14 LAB — US TRANSVAGINAL NON-OB

## 2014-03-14 LAB — US 3D RENDERING WO DEDICATED WORKSTATION

## 2014-03-14 NOTE — Progress Notes (Signed)
Called pt as requested by PCP  Left message on voicemail to call clinic back to speak with RN

## 2014-03-14 NOTE — Progress Notes (Signed)
Date of Service: 03/14/2014    SUBJECTIVE:  This is a 33 year old Portuguese-speaking [place] woman, new patient, [facility], who comes for evaluation for left breast pain and a possible lump.  A year or so ago, she noted, what seemed to be a small lump in the left upper breast.  She has been quite concerned about this.  She completed a mammogram and ultrasound in October 2013, both of which were negative, BI-RADS 1.  She continues to note mild, intermittent, vague achiness in the upper central left breast.  She has been quite concerned about under reacting to a breast lump as she has been taught that this is a concern.  She finds the pain quite minimal forgets about it after a couple of minutes and has not required any intervention.  She has never had nipple discharge.      PAST MEDICAL HISTORY:  Notable for preeclampsia pelvic pain, irregular menses, hypertension, family planning (IUD) and a developmental speech disorder.  History of tonsillectomy, history of C-section.    MEDICATIONS:  Include albuterol and the IUD.    ALLERGIES:  NKDA.    REPRODUCTIVE HISTORY:  Shows menarche at [age], last period not recalled, G1 P1, first birth at [age], did breast feed, does report prior OCP use.    FAMILY HISTORY:  No family history of breast or ovarian cancer.    SOCIAL HISTORY:  Reports tobacco use of 3 cigarettes per day, denies ETOH.  She works as a Landhousecleaner.    PHYSICAL EXAMINATION:  GENERAL:  She is a thin woman in no distress.  VITAL SIGNS:  Noted.  BREASTS:  Examined in 2 positions, visually symmetric.  There are no masses, no areas of tenderness, no skin changes.  Nipple areolar complexes are normal.  No inducible discharge, no supraclavicular or axillary adenopathy.  The area she points to in the upper central left breast is overlying a rib.    I have reassured her that her physical exam is unremarkable.  I do not feel a breast lump.  We have reviewed her imaging studies.  I have asked her  to return in approximately 3 months for a confirmatory clinical breast exam, and she will call sooner as needed.  She was greatly reassured.    Sincerely yours,    ___________________________  Reviewed and Electronically Signed By: Lacy DuverneyENISE LANDRIGAN DE FILIPPI APRN  Sig Date: 05/09/2014  Sig Time: 16:51:41  Dictated By: Lacy DuverneyENISE LANDRIGAN DE FILIPPI APRN  Dict Date: 03/14/2014 Dict Time: 09 14 AM    Dictation Date and Time:03/14/2014 09:14:23  Transcription Date and Time:03/14/2014 09:29:09  eScription Dictation id: [id number] Confirmation # :[id number]

## 2014-03-14 NOTE — Progress Notes (Signed)
This office note has been dictated. Account number [id number]

## 2014-03-14 NOTE — Progress Notes (Signed)
Please let Casey [name] know that her IUD appears to be in correct position which is reassuring

## 2014-03-15 NOTE — Telephone Encounter (Signed)
Message copied by Ralene OkHARRINGTON-HILTZ, Shynice Sigel on Wed Mar 15, 2014  9:17 AM  ------       Message from: Loel RoLEITE ALVES, DENISE       Created: Wed Mar 15, 2014  8:58 AM       Regarding: results       Contact: [phone]          Casey [name] [phone], 33 year old, female              CALL BACK NUMBER: [phone]       Best time to call back:        Cell phone:        Other phone:              Available times:              Patient's language of care: [place]Portuguese [place]              Patient needs a [place]Portuguese interpreter.              Patient's PCP: Vic RipperSabrina Selim, MD              Person calling on behalf of patient: Patient (self)              Calls today:  Test Results  Test Results Request from Patient         What test result(s) is the patient requesting?  Pelvic Ultrasound       Date testing was done 03/14/2014       Who ordered the test(s) Sabrina Selim        Message routed to        Mychart status reviewed No       Patient offered mychart No       Mychart activated No                ------

## 2014-03-15 NOTE — Progress Notes (Signed)
Called pt with the assistance of the portuguese interpreter-Casey [name]  Left message on voicemail to call clinic back to speak with RN

## 2014-03-15 NOTE — Progress Notes (Signed)
Spoke with pt and discussed that IUD was in correct position  Pt would like IUD removed, appt made

## 2014-03-23 ENCOUNTER — Ambulatory Visit (HOSPITAL_BASED_OUTPATIENT_CLINIC_OR_DEPARTMENT_OTHER): Payer: PRIVATE HEALTH INSURANCE | Admitting: Family Medicine

## 2014-03-23 ENCOUNTER — Encounter (HOSPITAL_BASED_OUTPATIENT_CLINIC_OR_DEPARTMENT_OTHER): Payer: Self-pay | Admitting: Family Medicine

## 2014-03-23 VITALS — BP 120/80 | Ht <= 58 in | Wt 102.0 lb

## 2014-03-23 DIAGNOSIS — Z30432 Encounter for removal of intrauterine contraceptive device: Secondary | ICD-10-CM

## 2014-03-23 DIAGNOSIS — J4520 Mild intermittent asthma, uncomplicated: Secondary | ICD-10-CM

## 2014-03-23 DIAGNOSIS — Z3009 Encounter for other general counseling and advice on contraception: Secondary | ICD-10-CM

## 2014-03-23 MED ORDER — NORGESTIMATE-ETH ESTRADIOL 0.25-35 MG-MCG PO TABS
1.0000 | ORAL_TABLET | Freq: Every day | ORAL | Status: DC
Start: 2014-03-23 — End: 2014-05-30

## 2014-03-23 MED ORDER — ALBUTEROL SULFATE HFA 108 (90 BASE) MCG/ACT IN AERS
2.0000 | INHALATION_SPRAY | Freq: Four times a day (QID) | RESPIRATORY_TRACT | Status: DC | PRN
Start: 2014-03-23 — End: 2015-06-19

## 2014-03-23 MED ORDER — MONTELUKAST SODIUM 10 MG PO TABS
10.0000 mg | ORAL_TABLET | Freq: Every evening | ORAL | Status: DC
Start: 2014-03-23 — End: 2021-06-25

## 2014-03-23 NOTE — Progress Notes (Signed)
SUBJECTIVE  33 y/o F with mirena IUD in place with documented proper position per u/s but with persistent sharp pain on L side during intercourse. She would like IUD removed and to try birth control pills instead.   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 Albuterol immediately relieves her symptoms. She uses this several times/week     BP 120/80   Ht 4\' 10"  (1.473 m)   Wt 102 lb (46.267 kg)   BMI 21.32 kg/m2  GEN: The patient appears well, in NAD.  NEURO: A/O x 3,  Sensation grossly intact  Pelvic: Vulva reveals no erythema, lesions or atrophic changes. Vagina normal with normal rugation, no lesions, polyps or discharge. Cervix appears normal without lesions. IUD strings not visualized but a kelly clamp was placed slightly in os and IUD easily removed     ASSESSMENT/PLAN:  33 y/o F requesting alternate birth control to IUD  --IUD removed today  --Rx sprintec x 3 mos. Condoms given for 1st month    2) mild intermittent asthma likely exacerbated by allergens such as cats or harsh chemicals   --Rx singulair which can help with allergy induced asthma  --Rx refill albuterol     The use of Oral contraceptive has been fully discussed with patient. This include the proper method to initiate, Sunday start, fifth day start or first day on menses start. The importance of taking a pill every day, the need for regular compliance in order for contraceptive to be effective. The instruction on what to do if case missed pills and warnings of possible side effects including BTB, nausea, breast tenderness, weigh changes, acne, HA. The risks of more serious side effects like strokes, vein thrombosis had been discussed with patient. Information had been given for patient to report any signs of serious problems as soon as possible.  Patient also had been advised to use back up like condoms in case that patient needs to take antibiotics and  condoms use during the first cycle of pill use. Patient stated that is a faithful condom user.

## 2014-05-02 ENCOUNTER — Encounter (HOSPITAL_BASED_OUTPATIENT_CLINIC_OR_DEPARTMENT_OTHER): Payer: Self-pay | Admitting: Family Medicine

## 2014-05-09 ENCOUNTER — Telehealth (HOSPITAL_BASED_OUTPATIENT_CLINIC_OR_DEPARTMENT_OTHER): Payer: Self-pay | Admitting: Family Medicine

## 2014-05-09 NOTE — Progress Notes (Signed)
Med on file with Pharmacy:  Pharmacist at [facility] confirmed that Birth Control has active refills remaining. No refill is required at this time.

## 2014-05-09 NOTE — Patient Instructions (Signed)
[facility]    Person calling on behalf of patient: Patient (self)    May list multiple medications in this section    Medicine Name: norgestimate-ethinyl estradiol (SPRINTEC 28) 0.25-35 MG-MCG per tablet    Dosage:     Frequency (how many pills, how many times a day):     Number of pills left    Documented patient preferred pharmacies:   [facility]  Phone: [phone] Fax: [phone]      CALL BACK NUMBER: [phone]      Patient's language of care: [place]    Patient needs a [place] interpreter.

## 2014-05-30 ENCOUNTER — Other Ambulatory Visit (HOSPITAL_BASED_OUTPATIENT_CLINIC_OR_DEPARTMENT_OTHER): Payer: Self-pay | Admitting: Family Medicine

## 2014-05-30 MED ORDER — NORGESTIMATE-ETH ESTRADIOL 0.25-35 MG-MCG PO TABS
1.0000 | ORAL_TABLET | Freq: Every day | ORAL | Status: DC
Start: 2014-05-30 — End: 2014-11-16

## 2014-05-30 NOTE — Progress Notes (Signed)
.  [facility]    Person calling on behalf of patient: Patient (self)    May list multiple medications in this section    Medicine Name:norgestimate-ethinyl estradiol (SPRINTEC 28) 0.25-35 MG-MCG per tablet    Dosage:0.25-35 MG-MCG    Frequency (how many pills, how many times a day):Take 1 tablet by mouth daily    Number of pills left: unknown    Documented patient preferred pharmacies:   [facility] ([facility]  Phone: [phone] Fax: [phone]      CALL BACK NUMBER:[phone]        Patient's language of care: [place] Brazilian    Patient needs a Tong[place] interpreter.

## 2014-05-30 NOTE — Patient Instructions (Signed)
[facility]    Person calling on behalf of patient: Patient (self)    May list multiple medications in this section    Medicine Name:norgestimate-ethinyl estradiol (SPRINTEC 28) 0.25-35 MG-MCG per tablet    Dosage:0.25-35 MG-MCG    Frequency (how many pills, how many times a day):Take 1 tablet by mouth daily    Number of pills left: unknown    Documented patient preferred pharmacies:   [facility] ([facility]  Phone: [phone] Fax: [phone]      CALL BACK NUMBER:[phone]        Patient's language of care: [place]    Patient needs a [place] interpreter.

## 2014-05-30 NOTE — Progress Notes (Signed)
PER Patient (self), Casey FabianJaqueline Manke is a 33 year old female has requested a refill of sprintec.      Last Office Visit: 03-23-14  Last Physical Exam: 03-02-14      OCP: last pap 09-07-12, Result:    Most Recent BP Reading(s)  03/23/14 : 120/80  03/14/14 : 90/64  03/02/14 : 142/80    Documented patient preferred pharmacies:    [facility] ([facility]  Phone: [phone] Fax: [phone]

## 2014-06-26 ENCOUNTER — Ambulatory Visit (HOSPITAL_BASED_OUTPATIENT_CLINIC_OR_DEPARTMENT_OTHER): Payer: PRIVATE HEALTH INSURANCE | Admitting: Nurse Practitioner

## 2014-06-26 VITALS — BP 120/68 | Temp 97.9°F

## 2014-06-26 DIAGNOSIS — N63 Unspecified lump in unspecified breast: Secondary | ICD-10-CM

## 2014-06-26 DIAGNOSIS — N644 Mastodynia: Principal | ICD-10-CM

## 2014-06-26 NOTE — Progress Notes (Signed)
Date of Service: 06/26/2014    SUBJECTIVE:  This 33 year old [place] and English-speaking [place] young woman returns for a 218-month followup confirmatory clinical breast exam after presenting in March with intermittent tenderness and possible self-detected lump in the left upper central breast.  A mammogram and ultrasound in October 2013 were negative.  She continues to note intermittent tenderness when lying in bed at night.  She is wondering if it is her heart.  She had a negative physical exam with us in March 2015, and I had asked her to return for 1 confirmatory check.  She does not feel a lump at this time and now thinks that she was feeling a rib.    Medical problems are stable.    MEDICATIONS:  Reviewed and confirmed.    PHYSICAL EXAMINATION:  GENERAL:  She is a thin woman in no distress.  VITAL SIGNS:  Noted.  BREASTS:  Examined in 2 positions and are visually symmetric.  There are no masses, no areas of tenderness, no skin changes.  Nipple areolar complexes are normal.  There is no inducible discharge, no supraclavicular or axillary adenopathy.    She has a normal physical exam.  She has mild intermittent tenderness in the left upper chest from time to time, noted most recently when lying in bed at night.  She has questions about her heart in the context of family members with heart problems.  She has had no shortness of breath, no true chest pain, no palpitations.  She has agreed  bring her concerns to her doctor for appropriate evaluation.  I see no evidence of a breast concern at this time.  I have encouraged her to follow up with us with any changes or concerns on her breast self exam.    ___________________________  Reviewed and Electronically Signed By: Lacy DuverneyENISE LANDRIGAN DE FILIPPI APRN  Sig Date: 09/12/2014  Sig Time: 17:02:13  Dictated By: Lacy DuverneyENISE LANDRIGAN DE FILIPPI APRN  Dict Date: 06/26/2014 Dict Time: 09 00 AM    Dictation Date and Time:06/26/2014 09:00:27  Transcription Date and  Time:06/26/2014 11:24:31  eScription Dictation id: [id number] Confirmation # :[id number]

## 2014-06-26 NOTE — Patient Instructions (Signed)
Autoconhecimento das mamas   (Breast Self-Awareness)  A prática do autoconhecimento das mamas pode detectar problemas precocemente, evitar significativamente complicações médicas e possivelmente, salvar sua vida. Ao praticar o autoconhecimento das mamas, você pode se familiarizar em como elas são e se elas se alterarem. Isto permite que você observe alterações precocemente. Isso também oferece alguma confiança de que a saúde de suas mamas está boa. Uma forma de aprender o que é normal para suas mamas e se elas estão se alterando é realizar um auto-exame delas.    Se você encontrar um inchaço que não estava presente anteriormente, é melhor entrar em contato com seu médico imediatamente. Outras observações que devem ser avaliadas por seu médico incluem escorrimento pelo mamilo, especialmente com sangue; alterações na pele ou vermelhidão; áreas onde a pele parece que está para dentro (retraída); ou novos inchaços e protuberâncias. A dor na mama raramente está associada com câncer (malignidade), mas também deve ser avaliada por um médico.   COMO REALIZAR UM AUTO-EXAME DAS MAMAS   O melhor momento para examinar suas mamas é de 5 a 7 dias após o término de seu período menstrual. Durante a menstruação, as mamas ficam mais granuladas e pode ser mais difícil notar alterações. Se você não menstrua, tiver atingido a menopausa ou tiver o útero removido (histerectomia), você deve examinar suas mamas a intervalos regulares, por exemplo, mensalmente. Se estiver amamentando, examine suas mamas após uma amamentação ou após usar uma bomba de remoção de leite. Implantes nas mamas não diminuem o risco de inchaços ou tumores, então, costume realizar o auto-exame das mamas conforme recomendado. Converse com seu médico sobre como determinar a diferença entre o implante e o tecido da mama. Também, converse sobre a quantidade de pressão que você deve exercer durante o exame. Com o tempo, você ficará mais familiarizada com as variações de  suas mamas e mais confortável com o exame. Um auto-exame das mamas exige que você retire todas as suas roupas acima da cintura.  1. Observe suas mamas e mamilos. Fique em frente a um espelho em um quarto com boa iluminação. Com as mãos em seu quadril, empurre-as firmemente para baixo. Observe uma diferença na forma, contorno e tamanho de uma mama à outra (assimetria). A assimetria inclui dobras, declives ou protuberâncias. Também, observe alterações na pele, como vermelhidão ou áreas escamosas nas mamas. Observe alterações nos mamilos, como derramamento, deslocamento ou vermelhidão.  2. Sinta suas mamas com cuidado. Isto é melhor feito no chuveiro ou ducha ao usar água ensaboada ou quando estiver deitada de costas. Coloque o braço (no lado da mama que estiver examinando) acima de sua cabeça. Utilize a parte de cima (não as pontas dos dedos) de seus três dedos do meio da mão oposta para sentir suas mamas. Comece na área abaixo do braço e utilize 2 cm de círculos sobrepostos para sentir sua mama. Utilize 3 níveis diferentes de pressão (leve, médio e firme) em cada círculo antes de se mover ao próximo. A pressão leve é necessária para sentir tecidos próximos à pele. A pressão média ajudará a sentir o tecido da mama um pouco mais profundamente, enquanto que a firme é necessária para sentir o tecido próximo às costelas. Continue com os círculos sobrepostos, movendo os dedos para baixo da mama até que sinta suas costelas abaixo da mama. Então, mova o comprimento de um dedo em direção ao centro do corpo. Continue a utilizar círculos de sobreposição de 2 cm para sentir suas mamas conforme move os dedos lentamente em direção à   clavícula, próxima à base do pescoço. Continue o exame para cima e para baixo utilizando todas as 3 pressões até alcançar o meio do tórax. Faça isso em cada mama à procura de inchaços ou alterações.  3.  Mantenha um registro escrito com alterações nas mamas ou achados normais para cada mama. Ao anotar  essas informações, você não precisará depender somente da memória em relação ao tamanho, sensibilidade ou localização. Escreva aonde está em seu ciclo menstrual, se ainda estiver menstruando.  O tecido da mama pode ter alguns inchaços ou tecido grosso. Contudo, consulte seu médico se sentir algo que a preocupa.     PROCURE UM MÉDICO SE:   · Notar uma alteração na forma, contorno ou tamanho de suas mamas ou mamilos.    · Notar alterações na pele, como vermelhidão ou áreas escamosas nas mamas ou mamilos.    · Tiver um fluxo não normal de seus mamilos.    · Sentir um novo inchaço ou áreas grossas incomuns.    Document Released: 12/01/2005 Document Revised: 11/17/2012  ExitCare® Patient Information ©2014 [employer].  Autoconhecimento das mamas   (Breast Self-Awareness)  A prática do autoconhecimento das mamas pode detectar problemas precocemente, evitar significativamente complicações médicas e possivelmente, salvar sua vida. Ao praticar o autoconhecimento das mamas, você pode se familiarizar em como elas são e se elas se alterarem. Isto permite que você observe alterações precocemente. Isso também oferece alguma confiança de que a saúde de suas mamas está boa. Uma forma de aprender o que é normal para suas mamas e se elas estão se alterando é realizar um auto-exame delas.    Se você encontrar um inchaço que não estava presente anteriormente, é melhor entrar em contato com seu médico imediatamente. Outras observações que devem ser avaliadas por seu médico incluem escorrimento pelo mamilo, especialmente com sangue; alterações na pele ou vermelhidão; áreas onde a pele parece que está para dentro (retraída); ou novos inchaços e protuberâncias. A dor na mama raramente está associada com câncer (malignidade), mas também deve ser avaliada por um médico.   COMO REALIZAR UM AUTO-EXAME DAS MAMAS   O melhor momento para examinar suas mamas é de 5 a 7 dias após o término de seu período menstrual. Durante a menstruação, as mamas  ficam mais granuladas e pode ser mais difícil notar alterações. Se você não menstrua, tiver atingido a menopausa ou tiver o útero removido (histerectomia), você deve examinar suas mamas a intervalos regulares, por exemplo, mensalmente. Se estiver amamentando, examine suas mamas após uma amamentação ou após usar uma bomba de remoção de leite. Implantes nas mamas não diminuem o risco de inchaços ou tumores, então, costume realizar o auto-exame das mamas conforme recomendado. Converse com seu médico sobre como determinar a diferença entre o implante e o tecido da mama. Também, converse sobre a quantidade de pressão que você deve exercer durante o exame. Com o tempo, você ficará mais familiarizada com as variações de suas mamas e mais confortável com o exame. Um auto-exame das mamas exige que você retire todas as suas roupas acima da cintura.  4. Observe suas mamas e mamilos. Fique em frente a um espelho em um quarto com boa iluminação. Com as mãos em seu quadril, empurre-as firmemente para baixo. Observe uma diferença na forma, contorno e tamanho de uma mama à outra (assimetria). A assimetria inclui dobras, declives ou protuberâncias. Também, observe alterações na pele, como vermelhidão ou áreas escamosas nas mamas. Observe alterações nos mamilos, como derramamento, deslocamento ou vermelhidão.  5. Sinta suas mamas com cuidado. Isto é   melhor feito no chuveiro ou ducha ao usar água ensaboada ou quando estiver deitada de costas. Coloque o braço (no lado da mama que estiver examinando) acima de sua cabeça. Utilize a parte de cima (não as pontas dos dedos) de seus três dedos do meio da mão oposta para sentir suas mamas. Comece na área abaixo do braço e utilize 2 cm de círculos sobrepostos para sentir sua mama. Utilize 3 níveis diferentes de pressão (leve, médio e firme) em cada círculo antes de se mover ao próximo. A pressão leve é necessária para sentir tecidos próximos à pele. A pressão média ajudará a sentir o tecido da  mama um pouco mais profundamente, enquanto que a firme é necessária para sentir o tecido próximo às costelas. Continue com os círculos sobrepostos, movendo os dedos para baixo da mama até que sinta suas costelas abaixo da mama. Então, mova o comprimento de um dedo em direção ao centro do corpo. Continue a utilizar círculos de sobreposição de 2 cm para sentir suas mamas conforme move os dedos lentamente em direção à clavícula, próxima à base do pescoço. Continue o exame para cima e para baixo utilizando todas as 3 pressões até alcançar o meio do tórax. Faça isso em cada mama à procura de inchaços ou alterações.  6.  Mantenha um registro escrito com alterações nas mamas ou achados normais para cada mama. Ao anotar essas informações, você não precisará depender somente da memória em relação ao tamanho, sensibilidade ou localização. Escreva aonde está em seu ciclo menstrual, se ainda estiver menstruando.  O tecido da mama pode ter alguns inchaços ou tecido grosso. Contudo, consulte seu médico se sentir algo que a preocupa.     PROCURE UM MÉDICO SE:   · Notar uma alteração na forma, contorno ou tamanho de suas mamas ou mamilos.    · Notar alterações na pele, como vermelhidão ou áreas escamosas nas mamas ou mamilos.    · Tiver um fluxo não normal de seus mamilos.    · Sentir um novo inchaço ou áreas grossas incomuns.    Document Released: 12/01/2005 Document Revised: 11/17/2012  ExitCare® Patient Information ©2014 [employer].

## 2014-06-26 NOTE — Progress Notes (Signed)
This office note has been dictated. Account number [id number]

## 2014-11-16 ENCOUNTER — Other Ambulatory Visit (HOSPITAL_BASED_OUTPATIENT_CLINIC_OR_DEPARTMENT_OTHER): Payer: Self-pay | Admitting: Family Medicine

## 2014-11-16 MED ORDER — NORGESTIMATE-ETH ESTRADIOL 0.25-35 MG-MCG PO TABS
1.0000 | ORAL_TABLET | Freq: Every day | ORAL | Status: DC
Start: 2014-11-16 — End: 2015-06-07

## 2014-11-16 NOTE — Progress Notes (Signed)
[facility]    Person calling on behalf of patient: Patient (self)    May list multiple medications in this section    Medicine Name: norgestimate-ethinyl estradiol (SPRINTEC 28) 0.25-35 MG-MCG per tablet    Dosage: 0.25-35 MG-MCG    Frequency (how many pills, how many times a day): Take 1 tablet by mouth daily.    Number of pills left: N/A    Documented patient preferred pharmacies:   [facility] ([facility])    Phone: [phone]   Fax: [phone]      CALL BACK NUMBER: [phone]

## 2014-11-16 NOTE — Progress Notes (Signed)
PER Patient (self), Casey [name] is a 33 year old female has requested a refill of sprintec.      Last Office Visit: 03/23/14  Last Physical Exam: 03/02/14      Other Med Adult:  Most Recent BP Reading(s)  06/26/14 : 120/68        CHOLESTEROL (mg/dl)   Date Value   16/10/960405/01/2011 160   ----------    LOW DENSITY LIPOPROTEIN DIRECT (mg/dl)   Date Value   54/09/811905/01/2011 104*   ----------    HIGH DENSITY LIPOPROTEIN (mg/dl)   Date Value   14/78/295605/01/2011 40   ----------  No results found for: TG        THYROID SCREEN TSH REFLEX FT4 (uIU/mL)   Date Value   09/07/2012 1.28   ----------      No results found for: TSH    No results found for: HGBA1C        INR (no units)   Date Value   04/02/2010 0.9*   ----------       Documented patient preferred pharmacies:    [facility] ([facility])  Phone: [phone] Fax: [phone]

## 2014-11-22 ENCOUNTER — Emergency Department (HOSPITAL_BASED_OUTPATIENT_CLINIC_OR_DEPARTMENT_OTHER)
Admission: RE | Admit: 2014-11-22 | Disposition: A | Discharge status: Home / Self Care | Payer: Self-pay | Source: Emergency Department | Attending: Physician Assistant | Admitting: Physician Assistant

## 2014-11-22 ENCOUNTER — Encounter (HOSPITAL_BASED_OUTPATIENT_CLINIC_OR_DEPARTMENT_OTHER): Payer: Self-pay

## 2014-11-22 LAB — POC URINALYSIS
BILIRUBIN, URINE: NEGATIVE
GLUCOSE,URINE: NEGATIVE
KETONE, URINE: NEGATIVE
NITRITE, URINE: NEGATIVE
PH URINE: 6 (ref 5.0–8.0)
PROTEIN, URINE: NEGATIVE
SPECIFIC GRAVITY, URINE: <=1.005 (ref 1.003–1.030)
UROBILINOGEN URINE: 0.2 (ref 0.2–1.0)

## 2014-11-22 LAB — URINALYSIS
BILIRUBIN, URINE: NEGATIVE
CASTS: NONE SEEN PER LPF
CRYSTALS: NONE SEEN
GLUCOSE, URINE: NEGATIVE MG/DL
KETONE, URINE: NEGATIVE MG/DL
NITRITE, URINE: NEGATIVE
PH URINE: 6 (ref 5.0–8.0)
PROTEIN, URINE: NEGATIVE MG/DL
SPECIFIC GRAVITY URINE: 1.005 (ref 1.003–1.035)

## 2014-11-22 LAB — URINE PREGNANCY TEST (POINT OF CARE): HCG QUALITATIVE URINE: NEGATIVE

## 2014-11-22 MED ORDER — IBUPROFEN 600 MG PO TABS
600.00 mg | ORAL_TABLET | Freq: Four times a day (QID) | ORAL | Status: AC | PRN
Start: 2014-11-22 — End: 2014-11-27

## 2014-11-22 MED ORDER — PHENAZOPYRIDINE HCL 100 MG PO TABS
100.00 mg | ORAL_TABLET | Freq: Once | ORAL | Status: AC
Start: 2014-11-22 — End: 2014-11-22
  Administered 2014-11-22: 100 mg via ORAL
  Filled 2014-11-22: qty 1

## 2014-11-22 MED ORDER — CIPROFLOXACIN HCL 500 MG PO TABS
500.00 mg | ORAL_TABLET | Freq: Once | ORAL | Status: AC
Start: 2014-11-22 — End: 2014-11-22
  Administered 2014-11-22: 500 mg via ORAL
  Filled 2014-11-22: qty 1

## 2014-11-22 MED ORDER — CIPROFLOXACIN HCL 500 MG PO TABS
500.00 mg | ORAL_TABLET | Freq: Two times a day (BID) | ORAL | Status: AC
Start: 2014-11-22 — End: 2014-11-29

## 2014-11-22 MED ORDER — ACETAMINOPHEN 325 MG PO TABS
650.00 mg | ORAL_TABLET | Freq: Once | ORAL | Status: AC
Start: 2014-11-22 — End: 2014-11-22
  Administered 2014-11-22: 650 mg via ORAL
  Filled 2014-11-22: qty 2

## 2014-11-22 MED ORDER — BENZONATATE 100 MG PO CAPS
100.00 mg | ORAL_CAPSULE | Freq: Three times a day (TID) | ORAL | Status: AC | PRN
Start: 2014-11-22 — End: 2014-11-27

## 2014-11-22 MED ORDER — PHENAZOPYRIDINE HCL 100 MG PO TABS
100.00 mg | ORAL_TABLET | Freq: Three times a day (TID) | ORAL | Status: AC | PRN
Start: 2014-11-22 — End: 2014-11-25

## 2014-11-22 NOTE — Narrator Note (Signed)
Awaiting results. Pt resting comfortably at present

## 2014-11-22 NOTE — Narrator Note (Signed)
Return from xray

## 2014-11-22 NOTE — Discharge Instructions (Signed)
DIAGNOSIS & TREATMENT:  You were seen in a [facility] for Urinary Tracit Infection (UTI). You were treated with Ciprofloxacin and pyridium.    TEST RESULTS:   You urine demonstrated infection. A Culture is being sent to our lab to make sure the antibiotics you were prescribed are effective against the bacterial infection you have. If not the hospital will call you with further instructions.    FURTHER CARE:  Take the pyridium as prescribed for the first 3 days. Take the antibiotic and finish out the full prescription even if you are feeling better.    NEW MEDICATIONS:  Ciprofloxacin:  This is an antibiotic which is only effective against bacterial infections, not viral. Please take this medication as directed and finish out the entire course of the medication. Failure to do this can cause resistance and not only allow your infection to return but also make it harder to treat the next time.    Pyridium (phenazopyridine): This medication is to reduce the pain you are having while urinating. This medication can stain clothes and contact lenses. Please wash you hands thoroughly after handling the pills.    Use tessalon perles for cough 1 tab every 8 hours    WHEN SHOULD YOU BE SEEN NEXT?  Please call your doctor and be seen with in the next 5-10 days for re-evaluation if symptoms are not improving.    WHEN SHOULD YOU RETURN TO THE ED?  Please return to the emergency room if you have fever that does resolve with tylenol or ibuprofen, you develop flank pain, your pain increases, your symptoms worsen, you get new symptoms or you are unable to schedule follow up care.

## 2014-11-22 NOTE — Narrator Note (Signed)
Patient Disposition    Patient education for diagnosis, medications, activity, diet and follow-up.  Patient left ED 9:57 PM.  Patient rep received written instructions.  Interpreter to provide instructions: No    Patient belongings with patient: YES    Have all existing LDAs been addressed? N/A    Have all IV infusions been stopped? N/A    Discharged to: Discharged to home

## 2014-11-22 NOTE — Narrator Note (Signed)
Sent to Ultrasound.

## 2014-11-22 NOTE — Narrator Note (Signed)
Pt sitting on foot of stretcher using cell phone.

## 2014-11-22 NOTE — ED Triage Note (Signed)
Pt with body aches and burning with urination since this am.

## 2014-11-22 NOTE — ED Provider Notes (Signed)
eMERGENCY dEPARTMENT Physician Assistant NOTE    The ED nursing record was reviewed.   The prior medical records as available electronically through Epic were reviewed.  The mode of arrival was Self on 11/22/2014  7:18 PM.    CHIEF COMPLAINT    Patient presents with:  UTI: PAIN W/URINATION & FLU LIKE SYMPTOMS?  Back Pain      HPI    Casey [name] is a 33 year old female with history of hypertension, irregular menstrual cycle and a history of pelvic pain he presents to the emergency department for evaluation of cough and congestion beginning 1 week ago.  She also developed increased urinary frequency and burning with urination this morning.  She describes diffuse body aches, particularly back pain.  No lateralization of this back pain.  No pain in her abdomen other than slight suprapubic "pressure."  No recorded fevers, sweats or chills.  No nausea or vomiting.  No change in appetite.  Patient is followed by Dr. Jettie Pagan.    PAST MEDICAL HISTORY      Past Medical History    Pre-eclampsia     Pelvic pain 06/17/2007    Irregular menstrual cycle 06/17/2007    HTN (hypertension)        PROBLEM LIST  Patient Active Problem List:     Developmental speech or language disorder     Open wound of wrist with complication     Mild intermittent asthma      SURGICAL HISTORY        Past Surgical History    UNLISTED PROCEDURE PHARYNX ADENOIDS/TONSILS      Comment age  36    OB ANTEPARTUM CARE CESAREAN DLVR & POSTPARTUM      Comment age 53    TONSILLECTOMY ONE-HALF AGE 71/>         CURRENT MEDICATIONS    1. diphenhydrAMINE (BENADRYL) 25 MG capsule  Route: Oral ZOX:WRUE 25 mg by mouth every 6 (six) hours as needed for Itching.  Dispense:  Refill:     2. norgestimate-ethinyl estradiol (SPRINTEC 28) 0.25-35 MG-MCG per tablet  Route: Oral AVW:UJWJ 1 tablet by mouth daily.  Dispense: 84 tablet Refill: 1    3. EXPIRED: montelukast (SINGULAIR) 10 MG tablet  Route: Oral XBJ:YNWG 1 tablet by mouth nightly.  Dispense: 30 tablet Refill:  2      ALLERGIES    Review of Patient's Allergies indicates:   Cat dander              Shortness of Breath    Comment:Improved with albuterol.    FAMILY HISTORY      Family History    In Good Health Mother     In Good Health Father     Cancer - Other FamHxNeg     Diabetes FamHxNeg     Heart FamHxNeg        SOCIAL HISTORY    Social History    Marital Status: Single              Spouse Name:                       Years of Education:                 Number of children: 1             Occupational History  Occupation          Associate Professor  Comment               housecleaning                               Social History Main Topics    Smoking Status: Current Some Day Smoker         Packs/Day: 0.00  Years:           Types: Cigarettes      Last Attempt to quit: 09/28/2011    Smokeless Status: Former Neurosurgeon                       Comment: 2 cigs/day    Alcohol Use: Yes                Comment: special occasions    Drug Use: No              Sexual Activity: Yes               Partners with: Female       Birth Control/Protection: IUD    Other Topics            Concern  Caffeine Concern        No    Comment:1 cup coffee/day  Sleep Concern           No  Stress Concern          No  Exercise                Yes  Self-Exams              Yes    Social History Narrative    12/02/2006  lives with partner of  7 yrs, 1 son- Benjie Karvonen born 2002        5/12: living with her boyfriend and her 33 y/o son. Occasional working in house cleaning        3/15: living with BS. Son in [place] . House cleaning         REVIEW OF SYSTEMS    The pertinent positives are reviewed in the HPI above. All other systems were reviewed and are negative.    PHYSICAL EXAM      VITAL SIGNS: Pulse 111   Temp(Src) 99 F   Resp 16   SpO2 100%   LMP 11/01/2014 (Approximate)     GENERAL: Well-developed, well-nourished, non-toxic appearance; Speaking full sentences. And appears to be in no acute distress  CV: Tachycardic rate with a regular rhythm,, No MRG, radial pulses 2+  B/L  Lower Extremities: Dorsalis pedis 2+ B/L,  no edema.   PULMONARY:  CTAB, No stridor, accessory muscle use or tripoding  ABDOMINAL: Soft, patient has tenderness of the suprapubic area, slightly more on the right, No rebound, guarding or masses, No murphy's, mcburney's or rovsing's.     Pelvic: Performed with chaperone  Bimanual: OS closed; adnexae w/o masses, tenderness over the area of the left adnexa; No cervical motion tenderness      MUSCULOSKELETAL : Moving all 4 extremities. Ambulatory w/ a steady gait. The spine straight and nontender.   Diffuse tenderness of the paralumbar musculature  GENITOURINARY: No CVA tenderness--no specific pain in the area of the flank  SKIN: Warm and dry, no rash . The skin color and turgor are normal.     RESULTS  Results for orders placed or performed during the hospital encounter of  11/22/14 (from the past 24 hour(s))   POC Urinalysis    Collection Time: 11/22/14  7:31 PM   Result Value    COLOR YELLOW    CLARITY CLEAR    GLUCOSE,URINE NEGATIVE    BILIRUBIN, URINE NEGATIVE    KETONE, URINE NEGATIVE    SPECIFIC GRAVITY, URINE <=1.005    UROBILINOGEN URINE 0.2    OCCULT BLOOD, URINE MODERATE (A)    PH URINE 6.0    PROTEIN, URINE NEGATIVE    NITRITE, URINE NEGATIVE    LEUKOCYTE ESTERASE SMALL (A)   Urine Pregnancy (Point of Care)    Collection Time: 11/22/14  7:38 PM   Result Value    HCG QUALITATIVE URINE Negative    ONBOARD CONTROL PRESENT? Yes   Urinalysis    Collection Time: 11/22/14  7:39 PM   Result Value    COLOR YELLOW    CLARITY CLEAR    GLUCOSE, URINE NEGATIVE    BILIRUBIN, URINE NEGATIVE    KETONE, URINE NEGATIVE    SPECIFIC GRAVITY URINE 1.005    OCCULT BLOOD, URINE LARGE (A)    PH URINE 6.0    PROTEIN, URINE NEGATIVE    NITRITE, URINE NEGATIVE    LEUKOCYTE ESTERASE TRACE (A)    MICROSCOPIC SEE RESULTS (A)    WHITE BLOOD CELLS URINE MANY 10-50 (A)    RED BLOOD CELLS URINE MODERATE 5-10 (A)    BACTERIA 0-5    CRYSTALS NONE SEEN    CASTS NONE SEEN    SQUAMOUS  EPITHELIAL CELLS 5-10 (A)   Urine Culture    Collection Time: 11/22/14  7:39 PM    Narrative    Source Details->clean void        RADIOLOGY  Ultrasound pelvis:  Small right follicular cyst    Ultrasound renal:  No hydronephrosis    MEDICATIONS ADMINISTERED ON THIS VISIT    Medication Orders Placed This Encounter  diphenhydrAMINE (BENADRYL) 25 MG capsule   Sig: Take 25 mg by mouth every 6 (six) hours as needed for Itching.  acetaminophen (TYLENOL) tablet 650 mg   Sig:   ciprofloxacin (CIPRO) tablet 500 mg   Sig:   Order Specific Question: Specify indication:  Answer: Infection - documented organism  Order Specific Question: Indicate type of infection:  Answer: Cystitis/UTI  Order Specific Question: Indicate type of therapy:  Answer: New antimicrobial therapy  phenazopyridine (PYRIDIUM) tablet 100 mg   Sig:   ciprofloxacin (CIPRO) 500 MG tablet   Sig: Take 1 tablet by mouth 2 (two) times daily.   Dispense:  14 tablet   Refill:  0  phenazopyridine (PYRIDIUM) 100 MG tablet   Sig: Take 1-2 tablets by mouth 3 (three) times daily as needed for Pain. for symptoms of painful urination   Dispense:  9 tablet   Refill:  0  ibuprofen (ADVIL,MOTRIN) 600 MG tablet   Sig: Take 1 tablet by mouth every 6 (six) hours as needed for Pain.   Dispense:  20 tablet   Refill:  0  benzonatate (TESSALON PERLES) 100 MG capsule   Sig: Take 1-2 capsules by mouth every 8 (eight) hours as needed for Cough. for cough   Dispense:  15 capsule   Refill:  0    ED COURSE & MEDICAL DECISION MAKING      I reviewed the patient's past medical history/problem list, past surgical history, medication list, social history and allergies. Pt remained hemodynamically stable during their stay in the emergency department.  Arrival: Pt arrived in stable condition and required no immediate interventions.    ED Decision Making & Course:     This is a 33 year old female who presents to the emergency department for evaluation of cold symptoms.  Four days ago and  urinary symptoms beginning yesterday.  She reports increased urinary frequency and burning with urination.  Also, patient specifically has pain in the area of the back when she further described her body aches.   Urine was received which revealed small leuk esterases.  This was sent for urinalysis.  Pregnancy was negative.  I performed a bimanual examination which revealed some right-sided adnexal discomfort.  To assess for any ovarian pathology or hydroneprhosis ultrasounds of the pelvis and kidneys were received.  Urinalysis resulted with many white blood cells indicative of a urinary tract infection.  Urine for early pyelonephritis this will be sent for urine culture.    Patient was treated with Ciprofloxacin and pyridium. Also will use tessalon perles for cough. She will follow up with PCP next week for re-evaluation.       Patient was advised to follow up with their primary care physician in seven days to evaluate for the resolution of their symptoms. They were also told to return to the ED if their symptoms are worse, they develop new symptoms, or if they are unable to schedule appropriate follow up care.     Condition: Stable  Disposition: Home    Diagnosis:   UTI  Body aches      Delia HeadySamantha Makendra Vigeant, PA-C

## 2014-11-23 ENCOUNTER — Telehealth (HOSPITAL_BASED_OUTPATIENT_CLINIC_OR_DEPARTMENT_OTHER): Payer: Self-pay | Admitting: Registered Nurse

## 2014-11-23 LAB — URINE CULTURE: URINE CULTURE/COLONY COUNT: NO GROWTH

## 2014-11-23 LAB — US RENAL

## 2014-11-23 LAB — US PELVIC NON-OB W TRANSVAG, 3D, DUPLEX

## 2014-11-23 NOTE — Progress Notes (Signed)
Called pt  For ED follow up r/t   UTI AND A COUGH  GIVEN PRESCRIPTIONS OF CIPRO, PYRIDIUM AND TESSALON PEARLS FOR HER COUGH  Using a tele interpreter,    No answer  Message left on VM to call the clinic for a f/u appt  And/or any questions or concerns

## 2014-11-24 NOTE — Progress Notes (Signed)
i called pt for ED f/u, no answer  Message left on VM [facility] an [facility]

## 2014-11-29 ENCOUNTER — Ambulatory Visit (HOSPITAL_BASED_OUTPATIENT_CLINIC_OR_DEPARTMENT_OTHER): Payer: PRIVATE HEALTH INSURANCE | Admitting: Family Medicine

## 2014-11-29 ENCOUNTER — Encounter (HOSPITAL_BASED_OUTPATIENT_CLINIC_OR_DEPARTMENT_OTHER): Payer: Self-pay | Admitting: Family Medicine

## 2014-11-29 VITALS — BP 138/80 | HR 97 | Temp 98.6°F | Ht <= 58 in | Wt 105.0 lb

## 2014-11-29 DIAGNOSIS — B349 Viral infection, unspecified: Principal | ICD-10-CM

## 2014-11-29 DIAGNOSIS — N898 Other specified noninflammatory disorders of vagina: Secondary | ICD-10-CM

## 2014-11-29 LAB — URINE DIP (POINT OF CARE)
BILIRUBIN, URINE: NEGATIVE
GLUCOSE, URINE: NEGATIVE mg/dl
KETONE, URINE: NEGATIVE mg/dl
LEUKOCYTE ESTERASE: NEGATIVE
NITRITE, URINE: NEGATIVE
PH URINE: 5.5 (ref 5.0–8.0)
PROTEIN, URINE: NEGATIVE mg/dl (ref 0–15)
SPECIFIC GRAVITY URINE: 1.02 (ref 1.003–1.030)
UROBILINOGEN URINE: 0.2 mg/dl (ref 0.2–1.0)

## 2014-11-29 MED ORDER — FLUCONAZOLE 150 MG PO TABS
150.00 mg | ORAL_TABLET | Freq: Once | ORAL | Status: AC
Start: 2014-11-29 — End: 2014-11-29

## 2014-11-29 MED ORDER — FLUCONAZOLE 150 MG PO TABS
150.0000 mg | ORAL_TABLET | Freq: Once | ORAL | Status: DC
Start: 2014-11-29 — End: 2014-11-29

## 2014-11-29 MED ORDER — BENZONATATE 200 MG PO CAPS
200.00 mg | ORAL_CAPSULE | Freq: Three times a day (TID) | ORAL | Status: AC | PRN
Start: 2014-11-29 — End: 2014-12-09

## 2014-11-29 NOTE — Progress Notes (Signed)
Patient is here to follow up.  She had been in the ER and seen for dysuria and pelvic pain - she had ultrasound of kidneys and of pelvis both or which were unremakable.  She had pain in her left back as well.  She did have hematuria and was started on cipro  Her urine culture eventually was negative.      Patient says that she has vaginal itching now. She says that she does not have any more dysuria and she says that it is just itching.  She says that she does not have any vaginal discharge. She is married.  She has her period today.    She has a cough - she says that she has not had a period.  She says that she has had a runny nose.  She says that she is up at night coughing.    Patient's medical, surgical, social and family history reviewed and updated.    PP: BP 138/80 mmHg   Pulse 97   Temp(Src) 98.6 F (37 C) (Temperature probe)   Ht 4\' 10"  (1.473 m)   Wt 47.628 kg (105 lb)   BMI 21.95 kg/m2   SpO2 100%   LMP 11/26/2014 (Exact Date)  Most Recent Weight Reading(s)  11/29/14 : 47.628 kg (105 lb)  03/23/14 : 46.267 kg (102 lb)  03/02/14 : 46.267 kg (102 lb)    Gen: pleasant woman NAD  HEENT: OMM OP without erythema TM's normal bilaterally  Neck: supple no LAN no thyromegally  Lungs: CTA bilaterally  Heart: RRR no murmur  Gyn declined      Urine mod heme - she does have her period right now    A/P: 33 year old woman  1. Hematuria: would like to recheck urine when not on menses and she agreed      2. Vaginal itch: recently on abx - will have her try fluconazole and if no relief will do exam as she declined today    3. Viral illness: tessalon pearls at this time  Symptomatic treatment with motrin or tylenol, rest, and fluids.  Patient will follow up if symptoms worsen after 3-5 days, if new symptoms develop or if the symptoms do not resolve in the next 2 weeks.      4. RHM: cpe with pap up to date

## 2015-06-07 ENCOUNTER — Other Ambulatory Visit (HOSPITAL_BASED_OUTPATIENT_CLINIC_OR_DEPARTMENT_OTHER): Payer: Self-pay | Admitting: Family Medicine

## 2015-06-07 NOTE — Progress Notes (Signed)
PER Pharmacy, Casey [name] is a 34 year old female has requested a refill of Previfem 0.25-35mg .      Last Office Visit: 11/29/14  Last Physical Exam: 03/02/14      Other Med Adult:  Most Recent BP Reading(s)  11/29/14 : 138/80          CHOLESTEROL (mg/dl)   Date Value   47/18/5501 160   ----------    LOW DENSITY LIPOPROTEIN DIRECT (mg/dl)   Date Value   58/68/2574 104 (H)   ----------    HIGH DENSITY LIPOPROTEIN (mg/dl)   Date Value   93/55/2174 40   ----------  No results found for: TG        THYROID SCREEN TSH REFLEX FT4 (uIU/mL)   Date Value   09/07/2012 1.28   ----------      No results found for: TSH    No results found for: HGBA1C        INR (no units)   Date Value   04/02/2010 0.9 (L)   ----------       Documented patient preferred pharmacies:    [facility] ([facility])  Phone: [phone] Fax: [phone]

## 2015-06-08 ENCOUNTER — Telehealth (HOSPITAL_BASED_OUTPATIENT_CLINIC_OR_DEPARTMENT_OTHER): Payer: Self-pay | Admitting: Family Medicine

## 2015-06-08 NOTE — Progress Notes (Signed)
RX sent today

## 2015-06-19 ENCOUNTER — Other Ambulatory Visit (HOSPITAL_BASED_OUTPATIENT_CLINIC_OR_DEPARTMENT_OTHER): Payer: Self-pay | Admitting: Family Medicine

## 2015-06-19 MED ORDER — ALBUTEROL SULFATE HFA 108 (90 BASE) MCG/ACT IN AERS
2.0000 | INHALATION_SPRAY | Freq: Four times a day (QID) | RESPIRATORY_TRACT | 0 refills | Status: DC | PRN
Start: 2015-06-19 — End: 2015-06-27

## 2015-06-19 NOTE — Progress Notes (Signed)
PER Patient (self),   Casey [name] is a 34 year old female who has requested a refill of:    proair hfa    Last Office Visit: 11/29/14  Last Physical Exam: 03/02/14      Other Med Adult:  Most Recent BP Reading(s)  11/29/14 : 138/80          CHOLESTEROL (mg/dl)   Date Value   16/10/960405/01/2011 160   ----------    LOW DENSITY LIPOPROTEIN DIRECT (mg/dl)   Date Value   54/09/811905/01/2011 104 (H)   ----------    HIGH DENSITY LIPOPROTEIN (mg/dl)   Date Value   14/78/295605/01/2011 40   ----------  No results found for: TG        THYROID SCREEN TSH REFLEX FT4 (uIU/mL)   Date Value   09/07/2012 1.28   ----------      No results found for: TSH    No results found for: HGBA1C        INR (no units)   Date Value   04/02/2010 0.9 (L)   ----------       Documented patient preferred pharmacies:    [facility] ([facility])  Phone: [phone] Fax: [phone]

## 2015-06-19 NOTE — Progress Notes (Signed)
[facility]    Person calling on behalf of patient: Patient (self)    May list multiple medications in this section    Medicine Name: albuterol (PROAIR HFA) 108 (90 BASE) MCG/ACT inhaler    Dosage:     Frequency (how many pills, how many times a day):     Number of pills left:     Documented patient preferred pharmacies:   [facility] ([facility])  Phone: [phone] Fax: [phone]        CALL BACK NUMBER: [phone]

## 2015-06-19 NOTE — Progress Notes (Signed)
Left message on voicemail for return call to clinic

## 2015-06-27 ENCOUNTER — Ambulatory Visit (HOSPITAL_BASED_OUTPATIENT_CLINIC_OR_DEPARTMENT_OTHER): Payer: PRIVATE HEALTH INSURANCE | Admitting: Internal Medicine

## 2015-06-27 VITALS — BP 140/86 | HR 106 | Temp 98.6°F | Ht <= 58 in | Wt 105.2 lb

## 2015-06-27 DIAGNOSIS — J4520 Mild intermittent asthma, uncomplicated: Secondary | ICD-10-CM

## 2015-06-27 DIAGNOSIS — B373 Candidiasis of vulva and vagina: Principal | ICD-10-CM

## 2015-06-27 DIAGNOSIS — B3731 Acute candidiasis of vulva and vagina: Secondary | ICD-10-CM

## 2015-06-27 DIAGNOSIS — R079 Chest pain, unspecified: Secondary | ICD-10-CM

## 2015-06-27 LAB — URINE DIP (POINT OF CARE)
BILIRUBIN, URINE: NEGATIVE
GLUCOSE, URINE: NEGATIVE mg/dl
KETONE, URINE: NEGATIVE mg/dl
LEUKOCYTE ESTERASE: NEGATIVE
NITRITE, URINE: NEGATIVE
PH URINE: 7 (ref 5.0–8.0)
PROTEIN, URINE: NEGATIVE mg/dl (ref 0–15)
SPECIFIC GRAVITY URINE: 1.015 (ref 1.003–1.030)
UROBILINOGEN URINE: 0.2 mg/dl (ref 0.2–1.0)

## 2015-06-27 MED ORDER — FLUCONAZOLE 150 MG PO TABS
150.00 mg | ORAL_TABLET | Freq: Once | ORAL | 0 refills | Status: AC
Start: 2015-06-27 — End: 2015-06-27

## 2015-06-27 MED ORDER — ALBUTEROL SULFATE HFA 108 (90 BASE) MCG/ACT IN AERS
2.0000 | INHALATION_SPRAY | Freq: Four times a day (QID) | RESPIRATORY_TRACT | 11 refills | Status: DC | PRN
Start: 2015-06-27 — End: 2015-12-25

## 2015-06-27 NOTE — Progress Notes (Signed)
[facility]    CC: 3 reasons    HPI: Casey FabianJaqueline [name] is a 34 year old female who presents to clinic for:    - vaginal itching. Was taking antibiotics for 7 days for dental work, then developed itchiness on her vagina. Never happened before. She did try the OTC cream, but it burned so much.     - chest pain when she wakes up in the morning. Works doing English as a second language teacherhousecleaning, and is allergic to cats, cleaning products. Does take benadryl every night. Wakes up every morning with a mild pain in her chest. Heart starts beating faster. She then stops noticing the pain when she goes about her job and her day. Pain in over L pectoral area. She also feels a little lump in the breast there, was checked out, is not cancer, but still there and she's still worried (radiologic eval showed nothing). Not worse with pushing or movement, not a burning. No trouble breathing. Really feels on top of the heart. This has been going on for a very long time - like a year. No chest pain when working/carrying equipment. Grandfather had stroke, no family hx of MI.   - pt smokes 3-4 cigs/day when she gets home from work.     - needs inhaler refill    Review of systems:     Gen:No fevers or unexplained weight loss.   Neuro: No visual changes. No paresthesias or new headaches.   HEENT: No sore throat or ear ache.    Pulm: No cough. No dyspnea.  Cardiac: No chest pain on exertion.    GI: No abdominal pain or change in bowel habits.     Psych: No sadness or anxiety that interferes with day-to-day activities.   Allergy: No enlarged nodes.        Medications, Allergies, Problem list, and Social history were all reviewed and updated in Epic as appropriate.       Physical Exam:  BP 140/86  Pulse 106  Temp 98.6 F (37 C) (Temperature probe)  Ht 4\' 10"  (1.473 m)  Wt 47.7 kg (105 lb 3.2 oz)  LMP 06/12/2015  SpO2 100%  BMI 21.99 kg/m2    Gen: well appearing female in no distress  Psych: mood is good, affect is bright with normal range,  speech fluent and coherent  HEENT: PERRL, EOMI, no icterus.   CV: RRR, no murmurs  Breast: no masses/lump in the L breast  Lung: good air movement bilaterally, very slight expiratory wheeze bilaterally  Abd: soft, non-tender, non-distended  Ext: 2+ pedal pulses bilaterally, no lower extremity edema  Neuro: strength and sensation grossly intact bilaterally. Gait is narrow-based and stable.  Skin: no rashes or lesions, no skin breakdown on lower extremities.      ASSESMENT/PLAN:  (B37.3) Candidiasis of vulva and vagina  (primary encounter diagnosis)  Comment: typical symptoms and timecourse - following abx use  Plan: fluconazole, topical yogurt for symptoms    (J45.20) Mild intermittent asthma without complication  Comment: needs refills      (R07.9) Chest pain, unspecified type  Comment: I advised her that this is very unlikely to be cardiac, given that it actually gets better as she gets more active, and never occurs with exertion. I also don't feel a bump/lump in the breast. 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   Plan: reassure, monitor.       The patient was ready to learn and no apparent learning  or adherence barriers were identified. I explained the diagnosis and treatment plan, and the patient expressed understanding of the content. I attempted to answer any questions regarding the diagnosis and the proposed treatment.    Possible side effects of the new prescribed medication were explained, including stomach upset. We discussed the patients current medications.  We discussed the importance of medication compliance. The patient expressed understanding and no barriers to adherence were identified.    she has been advised to call or return with any worsening or new problems          Casey [name] E. Casey Rud, MD

## 2015-06-28 LAB — POC URINALYSIS
BILIRUBIN, URINE: NEGATIVE
GLUCOSE,URINE: NEGATIVE
KETONE, URINE: NEGATIVE
LEUKOCYTE ESTERASE: NEGATIVE
NITRITE, URINE: NEGATIVE
PH URINE: 7 (ref 5.0–8.0)
PROTEIN, URINE: NEGATIVE
SPECIFIC GRAVITY, URINE: 1.015 (ref 1.003–1.030)
UROBILINOGEN URINE: 0.2 (ref 0.2–1.0)

## 2015-12-25 ENCOUNTER — Ambulatory Visit (HOSPITAL_BASED_OUTPATIENT_CLINIC_OR_DEPARTMENT_OTHER): Payer: PRIVATE HEALTH INSURANCE | Admitting: Physician Assistant

## 2015-12-25 ENCOUNTER — Encounter (HOSPITAL_BASED_OUTPATIENT_CLINIC_OR_DEPARTMENT_OTHER): Payer: Self-pay | Admitting: Physician Assistant

## 2015-12-25 VITALS — BP 140/75 | HR 80 | Temp 98.2°F | Wt 101.0 lb

## 2015-12-25 DIAGNOSIS — N760 Acute vaginitis: Secondary | ICD-10-CM

## 2015-12-25 DIAGNOSIS — J45901 Unspecified asthma with (acute) exacerbation: Secondary | ICD-10-CM

## 2015-12-25 LAB — POC URINALYSIS
BILIRUBIN, URINE: NEGATIVE
GLUCOSE,URINE: NEGATIVE
KETONE, URINE: NEGATIVE
LEUKOCYTE ESTERASE: NEGATIVE
NITRITE, URINE: NEGATIVE
PH URINE: 6 (ref 5.0–8.0)
PROTEIN, URINE: NEGATIVE
SPECIFIC GRAVITY, URINE: 1.01 (ref 1.003–1.030)
UROBILINOGEN URINE: 0.2 (ref 0.2–1.0)

## 2015-12-25 LAB — VAGINITIS PANEL (DNA PROBE)
CANDIDA SPECIES: NEGATIVE
GARDNERELLA VAGINALIS: NEGATIVE
TRICHOMONAS VAGINALIS: NEGATIVE

## 2015-12-25 MED ORDER — ALBUTEROL SULFATE HFA 108 (90 BASE) MCG/ACT IN AERS
2.0000 | INHALATION_SPRAY | Freq: Four times a day (QID) | RESPIRATORY_TRACT | 3 refills | Status: DC | PRN
Start: 2015-12-25 — End: 2016-04-29

## 2015-12-25 MED ORDER — FLUCONAZOLE 150 MG PO TABS
150.00 mg | ORAL_TABLET | Freq: Once | ORAL | 0 refills | Status: AC
Start: 2015-12-25 — End: 2015-12-25

## 2015-12-25 MED ORDER — FLUTICASONE PROPIONATE HFA 220 MCG/ACT IN AERO
2.0000 | INHALATION_SPRAY | Freq: Two times a day (BID) | RESPIRATORY_TRACT | 0 refills | Status: DC
Start: 2015-12-25 — End: 2021-06-25

## 2015-12-25 NOTE — Progress Notes (Signed)
HISTORY OF PRESENT ILLNESS    Casey [name] is a 35 year old female who presents today with 3 weeks of vaginal itching. She has noticed thick white discharge. No pelvic pain. No fever. Pt is married and monogamous. She says her husband developed some genital pruritis after intercourse a few weeks ago but his symptoms have resolved. She used an OTC cream treatment for 1 day last week but she still has some pruritis.     Asthma: Pt is also asking for a refill of her albuterol. She says she got a "cold" 2 weeks ago and was very ill with a severe cough. She says she is feeling better and the cough has improved but she still gets SOB frequently and does not feel the albuterol is working as well as it usually does. Prior to her cold her breathing was fine.         REVIEW OF SYSTEMS    Pertinent positives and negatives per HPI above.     Constitutional: No fever or chills. No fatigue or weight loss.    BP 140/75  Pulse 80  Temp 98.2 F (36.8 C) (Temporal)  Wt 45.8 kg (101 lb)  LMP  (Exact Date)  SpO2 100%  BMI 21.11 kg/m2    PHYSICAL EXAM   General: Well appearing, no acute distress. Alert and oriented x 3.   Psych: Mood and affect normal.  GYN: External genitalia WNL. Small amount of white vaginal discharge but appears normal. Culture done.   No CMT or adnexal tenderness.   Cardio: RRR, no murmurs.  Resp: Mild exp wheezing, more on left than on right. No crackles.     ASSESSMENT & PLAN    (N76.0) Vaginitis and vulvovaginitis  (primary encounter diagnosis)    Plan: URINE DIPSTICK, POC URINALYSIS, VAGINITIS PANEL        (DNA PROBE), fluconazole (DIFLUCAN) 150 MG         tablet           (J45.901) Asthma exacerbation    Plan: albuterol (PROAIR HFA) 108 (90 BASE) MCG/ACT         inhaler, fluticasone (FLOVENT HFA) 220 MCG/ACT         Inhaler until pt is back to baseline.    RTO if still having SOB or cough next week.    The patient indicates understanding of these issues and agrees with the plan.   No  apparent learning barriers were identified. I attempted to answer any questions regarding the diagnosis and the proposed treatment.  The patient was given an After Visit Summary sheet that lists all of their medications with directions, their allergies, orders placed during this encounter, and follow- up instructions.    Sonia SideErika Nova Evett, PA-C

## 2015-12-27 ENCOUNTER — Encounter (HOSPITAL_BASED_OUTPATIENT_CLINIC_OR_DEPARTMENT_OTHER): Payer: Self-pay | Admitting: Physician Assistant

## 2016-04-29 ENCOUNTER — Encounter (HOSPITAL_BASED_OUTPATIENT_CLINIC_OR_DEPARTMENT_OTHER): Payer: Self-pay | Admitting: Internal Medicine

## 2016-04-29 ENCOUNTER — Ambulatory Visit (HOSPITAL_BASED_OUTPATIENT_CLINIC_OR_DEPARTMENT_OTHER): Payer: Medicaid Other | Admitting: Internal Medicine

## 2016-04-29 VITALS — BP 134/79 | HR 88 | Temp 98.8°F | Ht <= 58 in | Wt 101.8 lb

## 2016-04-29 DIAGNOSIS — S39012A Strain of muscle, fascia and tendon of lower back, initial encounter: Principal | ICD-10-CM

## 2016-04-29 MED ORDER — NORGESTIMATE-ETH ESTRADIOL 0.25-35 MG-MCG PO TABS
ORAL_TABLET | ORAL | 3 refills | Status: DC
Start: 2016-04-29 — End: 2017-04-20

## 2016-04-29 MED ORDER — IBUPROFEN 600 MG PO TABS
600.00 mg | ORAL_TABLET | Freq: Four times a day (QID) | ORAL | 0 refills | Status: AC | PRN
Start: 2016-04-29 — End: 2016-05-06

## 2016-04-29 MED ORDER — ALBUTEROL SULFATE HFA 108 (90 BASE) MCG/ACT IN AERS
2.0000 | INHALATION_SPRAY | Freq: Four times a day (QID) | RESPIRATORY_TRACT | 3 refills | Status: DC | PRN
Start: 2016-04-29 — End: 2019-05-26

## 2016-04-29 NOTE — Patient Instructions (Signed)
Ibuprofen -- estomago.   tomar com comida.

## 2016-04-29 NOTE — Progress Notes (Signed)
Casey [name] N tel, [place]     Cc:  Patient presents with:  Back Pain: lower back pain       Seen with family/friend/trainee/chaperone no    Issues to be followed up from last visit are :  Vaginitis, asthma    Recent consults:   no   Recent ER visit: no  Recent Hospitalizations: no    SUBJECTIVE   1)  This back pain of L mid/upper back in a small area, deep,  started 2 weeks ago.   Each day, it starts very minor, in the morning, then worsens a great deal, as the day continues.    Feels weak, lost wt.   Is a little bit emotionally down about this issue.   Tylenol is not very helpful.     3 wk ago, her 6111 mo old niece passed away.      Works as Landhousecleaner.   Works 40 hours .  Does not think work has been particularly heavy or stressful.   Recalls no overuse or injury at home.      No problems with asthma recently.       -----------------------  Meds reviewed:/ refills  yes  Aller checked: yes   Snapshot/Health Maint : reviewed - counselled   "open chart" epic inbasket messages review:n/a  [facility]  Important/necessary  f/u's  from problem list are :  reviewed  ------------------------  Objective   04/29/16  1113   BP: 134/79   Pulse: 88   Temp: 98.8 F (37.1 C)   TempSrc: Temporal   SpO2: 100%   Weight: 46.2 kg (101 lb 12.8 oz)   Height: 4\' 10"  (1.473 m)       Gen: in no distress    Exam  R mid back not tender.   No deformity.   Midline not tender.      Chest - clear    DATA     A/p    (S39.012A) Back strain, initial encounter  Comment:   Plan: ibuprofen (ADVIL,MOTRIN) 600 MG tablet        Stretch        Heat    2) fam planning - ocp refilled    3) asthma - quiescent.    She is not using any inhaler now.   I did refill her albuterol

## 2016-06-05 ENCOUNTER — Ambulatory Visit (HOSPITAL_BASED_OUTPATIENT_CLINIC_OR_DEPARTMENT_OTHER): Payer: Self-pay | Admitting: Internal Medicine

## 2016-10-10 ENCOUNTER — Encounter (HOSPITAL_BASED_OUTPATIENT_CLINIC_OR_DEPARTMENT_OTHER): Payer: Self-pay | Admitting: Internal Medicine

## 2016-10-10 ENCOUNTER — Ambulatory Visit (HOSPITAL_BASED_OUTPATIENT_CLINIC_OR_DEPARTMENT_OTHER): Payer: Medicaid Other | Admitting: Internal Medicine

## 2016-10-10 VITALS — BP 134/93 | HR 79 | Temp 98.0°F | Wt 105.0 lb

## 2016-10-10 DIAGNOSIS — R079 Chest pain, unspecified: Secondary | ICD-10-CM

## 2016-10-10 DIAGNOSIS — Z0000 Encounter for general adult medical examination without abnormal findings: Principal | ICD-10-CM

## 2016-10-10 DIAGNOSIS — F17200 Nicotine dependence, unspecified, uncomplicated: Secondary | ICD-10-CM

## 2016-10-10 DIAGNOSIS — Z23 Encounter for immunization: Secondary | ICD-10-CM

## 2016-10-10 DIAGNOSIS — R109 Unspecified abdominal pain: Secondary | ICD-10-CM

## 2016-10-10 DIAGNOSIS — R10A0 Flank pain, unspecified side: Secondary | ICD-10-CM

## 2016-10-10 LAB — LIPID PANEL
Cholesterol: 145 mg/dL (ref 0–239)
HIGH DENSITY LIPOPROTEIN: 71 mg/dL (ref 40–?)
LOW DENSITY LIPOPROTEIN DIRECT: 72 mg/dL (ref 0–189)
TRIGLYCERIDES: 86 mg/dL (ref 0–150)

## 2016-10-10 MED ORDER — NICOTINE POLACRILEX 4 MG MT GUM
4.00 mg | CHEWING_GUM | OROMUCOSAL | 2 refills | Status: AC | PRN
Start: 2016-10-10 — End: 2016-12-09

## 2016-10-10 MED ORDER — NICOTINE POLACRILEX 4 MG MT GUM: 4 mg | each | OROMUCOSAL | 2 refills | 0 days | Status: AC | PRN

## 2016-10-10 NOTE — Progress Notes (Signed)
[facility]    CC: pain    HPI: Casey [name] is a 35 year old female who presents to clinic for complete physical exam and for:    - has had pain on R side for 6 months. Not terrible, and not all the time. Does not actually bother her at work, she notices more when she's just sitting. Came out of nowhere. Not getting worse, just staying the same.     - also having some pain in her chest every morning when she wakes up. Feels pain in anterior chest, worse than the pain in her chest. This has been going on her whole life. Not every morning, but is becoming more frequent. No trouble breathing, feels like burning. Resolves on it's own, doesn't come back with activity. No relation to eating.   - sometimes gets bad taste in her mouth  - takes benedryl before bed every night, to help with allergies.     - her sister lost her 4611 month old baby, so the whole family has been very upset. That was 6 months ago.   - and their uncle had a heart attack 4 months ago  - she's feeling tired, feeling down    - uses inhaler when exposed to something at work that triggers her asthma (not daily). Also needs benadryl every day.     Health Maintenance:  Safety: safe in relationships  Cancer screenings: pap up to date  Lipids/metabolic: lipid panel > 5 yrs old. Active at work (cleaning), not that much extra exercise.   Vaccinations: wants flu today    Review of systems:     Gen: No fevers or unexplained weight loss.   Neuro: No visual changes. No paresthesias or new headaches.   HEENT: No sore throat or ear ache.    Pulm: No cough. No dyspnea.  Cardiac: + chest pain as above    GI: No abdominal pain or change in bowel habits.  .   Derm: No new rashes or skin changes.    Psych: + sadness  Allergy: No enlarged nodes.  No new itching, sneezing or wheezing.Marland Kitchen.    PMH:     Patient Active Problem List:     Developmental speech or language disorder     Open wound of wrist with complication     Mild intermittent asthma      Allergies:  Review of Patient's Allergies indicates:   Cat dander              Shortness of Breath    Comment:Improved with albuterol.    Medications:   Current Outpatient Prescriptions on File Prior to Visit:  norgestimate-ethinyl estradiol (PREVIFEM) 0.25-35 MG-MCG per tablet TAKE 1 TABLET BY MOUTH DAILY. Disp: 84 tablet Rfl: 3   albuterol (PROAIR HFA) 108 (90 Base) MCG/ACT inhaler Inhale 2 puffs into the lungs every 6 (six) hours as needed for Wheezing or Shortness of breath (when exposed to cat) Disp: 1 Inhaler Rfl: 3   fluticasone (FLOVENT HFA) 220 MCG/ACT inhaler Inhale 2 puffs into the lungs 2 (two) times daily Disp: 1 Inhaler Rfl: 0   diphenhydrAMINE (BENADRYL) 25 MG capsule Take 25 mg by mouth every 6 (six) hours as needed for Itching. Disp:  Rfl:    montelukast (SINGULAIR) 10 MG tablet Take 1 tablet by mouth nightly. Disp: 30 tablet Rfl: 2     No current facility-administered medications on file prior to visit.     Family/Social History:     Social  History Narrative    2017: living with partner of 15 year. Son in [place], living with her mother and sister. 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         family history includes Cancer - Lung in her paternal uncle; Diabetes in her famhxneg; Heart in her maternal grandfather and maternal uncle; In Good Health in her father and mother.        Physical Exam:  BP (!) 134/93 (Site: LA, Position: Sitting, Cuff Size: Reg)  Pulse 79  Temp 98 F (36.7 C) (Temporal)  Wt 47.6 kg (105 lb)  LMP 09/13/2016  SpO2 100%  BMI 21.95 kg/m2      Gen: well appearing female in no distress  Psych: mood is good, affect is bright with normal range, speech fluent and coherent  HEENT: PERRLA, EOMI, TMs clear, throat non-erythematous and without exudate.   NECK: no palpable lymph nodes, no thyroid enlargement or masses,   CV: RRR, no murmurs. No tenderness to palpation.   Back: non-tender.   Lung: good air movement bilaterally, no wheezes, crackles or  ronchi.  Abd: soft, non-tender, non-distended   Ext: 2+ pedal pulses bilaterally, no lower extremity edema  Neuro: strength and sensation grossly intact bilaterally. Gait is narrow-based and stable.  Skin: no rashes or lesions, no skin breakdown on lower extremities.      ASSESMENT/PLAN:    (Z00.00) Routine general medical examination at health care facility  (primary encounter diagnosis)  Comment: Updated routine care. Discussed dentist visits, cardiovascular exercise 3-5x/week  Plan: HEALTH CARE PROXY, LIPID PANEL        - I offered support/sympathy for the sad family events of late, assured her that crying is still okay/normal, but we can arrange for someone to talk to if she wants. Will consider in the future.    (R10.9) Flank pain  Comment: really more intermittent thoracic back pain  Plan: gave handout of stretches.     (R07.9) Chest pain, unspecified type  Comment: the pain does not sound cardiac, given only when waking up, never with activity. I wonder if its' really GERD.   Plan: gave handout on gerd, she will see if it's worse after any particular foods, try avoiding.     (Z23) Need for prophylactic vaccination and inoculation against influenza  Comment:   Plan: IMMUNIZATION ADMIN SINGLE, RN, PR IIV4 VACC         SPLIT VIRUS 0.5 ML DOS FOR IM USE            (F17.200) Tobacco use disorder  Comment: only smoking a few cigarettes after work, so just having gum for those times might be enough.   Plan: nicotine polacrilex (NICORETTE) 4 MG gum        - we discussed how to use it, that she can some back for other/additional options if this does not help.       The patient was ready to learn and no apparent learning or adherence barriers were identified. I explained the diagnosis and treatment plan, and the patient expressed understanding of the content. I attempted to answer any questions regarding the diagnosis and the proposed treatment.    Possible side effects of the new prescribed medication were explained,  including skin irritation. We discussed the patients current medications.  We discussed the importance of medication compliance. The patient expressed understanding and no barriers to adherence were identified.    she has been advised to call  or return with any worsening or new problems      Wells Guiles E. Stann Mainland, MD

## 2016-10-10 NOTE — Patient Instructions (Addendum)
Patient Education       Patient Education   Index Spanish version      Patient Education   Index        Doena do refluxo gastroesofgico, Adultos   (Gastroesophageal Reflux Disease, Adult)   A doena do refluxo gastroesofgico (DRGE) ocorre quando o cido do estmago sobe ao esfago. Quando o cido entra em contato com o esfago, ele provoca dor (inflamao) no esfago. Algumas vezes, a DRGE pode criar pequenos furos (lceras) no revestimento do esfago.  CAUSAS    Aumento do peso corporal. Isto provoca presso no estmago, fazendo com que o cido suba ao esfago.   Tabagismo. Isto aumenta a produo de cido no estmago.   Ingerir bebidas alcolicas. Isto provoca a diminuio da presso no esfncter esofgico inferior (vlvula ou anel de msculo entre o esfago e o Teaching laboratory technicianestmago), permitindo que o cido do estmago passe ao esfago.   Refeies tarde da noite e um estmago cheio. Isto aumenta a presso e produo de cido no estmago.   Um esfncter esofgico inferior mal formado.  Algumas vezes, nenhuma causa  encontrada.   SINTOMAS    Dor de queimao na parte inferior do peito atrs do esterno e na rea central do estmago. Isto pode ocorrer duas vezes por semana ou com mais frequncia.   Dificuldade em engolir.   Garganta inflamada.   Tosse seca.   Sintomas como a asma incluindo aperto no peito, dificuldade em respirar, ou ofegao.  DIAGNSTICO   Seu mdico pode diagnosticar a DRGE baseado em seus sintomas. Em alguns casos, raios-X ou outros exames podem ser feitos para verificar complicaes ou a condio de Energy managerseu estmago e esfago.   TRATAMENTO   Seu mdico pode recomendar medicamentos de venda livre ou de prescrio para ajudar a diminuir a produo de cido. Consulte seu mdico antes de comear a tomar qualquer medicamento novo.   INSTRUES PARA TRATAMENTO DOMICILIAR    Altere os fatores que Conservator, museum/gallerypode controlar. Pea orientao ao seu mdico quanto a perda de peso, Estate manager/land agentdeixar de fumar e consumo de  lcool.   Evite alimentos e bebidas que tornem seus sintomas piores, tais como:   Cafena ou bebidas alcolicas.   Chocolate.   Condimentos de hortel ou menta.   Alho e cebola.   Alimentos apimentados.   Frutas ctricas, como laranjas e limes.   Alimentos a base de [place] Inctomate como molhos, pimentas, salsa e pizza.   Alimentos fritos e gordurosos.   Deite no mnimo, 3 horas aps uma refeio ou lanche.   Tente comer refeies em pores menores e com maior frequncia.   Vista roupas largas. No vista nada apertado em torno da cintura que provoque presso em seu Cole Campestmago.   Eleve a cabeceira de sua cama de 6 a 8 polegadas (15 a 20 cm) com blocos de tijolo para ajud-lo a dormir. Travesseiros extras no ajudaro.   Somente use remdios de venda livre ou com receita para dor, desconforto ou febre conforme instrudo pelo seu mdico.   No tome aspirina, ibuprofeno, ou outro medicamento anti-inflamatrio no esteroide (NSAIDs).  PROCURE UM MDICO IMEDIATAMENTE SE:    Tiver dor nos braos, pescoo, mandbula, dentes, ou costas.  Sua dor aumentar Patient Education          ou mudar na intensidade e durao.   Desenvolver nuseas, vmito ou sudorese. (diaforese).   Sentir falta de ar ou tontura.   Seu vmito for Peabody Energyverde, amarelo, preto, ou Northrop Grummanparecer como gros de caf ou sangue.  Suas fezes forem vermelhas, com sangue, ou pretas.  Esses sintomas podem ser sinais de outros problemas, tais como doena cardaca, sangramento gstrico ou esofgico.   CERTIFIQUE-SE DE :    Compreender estas instrues.   Observar as suas condies.   Procurar um mdico imediatamente se no se sentir bem ou piorar.     Estas informaes no se destinam a substituir as recomendaes de seu mdico. No deixe de discutir quaisquer dvidas com seu mdico.     Document Released: 09/10/2005 Document Revised: 12/22/2014  Elsevier Interactive Patient Education [employer]

## 2016-10-14 ENCOUNTER — Encounter (HOSPITAL_BASED_OUTPATIENT_CLINIC_OR_DEPARTMENT_OTHER): Payer: Self-pay | Admitting: Internal Medicine

## 2016-10-16 ENCOUNTER — Other Ambulatory Visit (HOSPITAL_BASED_OUTPATIENT_CLINIC_OR_DEPARTMENT_OTHER): Payer: Self-pay

## 2016-10-17 ENCOUNTER — Telehealth (HOSPITAL_BASED_OUTPATIENT_CLINIC_OR_DEPARTMENT_OTHER): Payer: Self-pay

## 2016-10-17 NOTE — Progress Notes (Signed)
Telephone call with Mental Health Care Partner in Primary Care      PROBLEM: grief    ASSESSMENT: Ms. Edward JollySilva returned writer's outreach call, patient reports the recent loss of an 7311 month old niece. Patient talks about her grief, and sadness seeing her sister pain and suffering. She declined therapy services at this time. She was educated in Kohl'sntegrated Care Model encouraged to call for support as needed.    Patient Active Problem List:     Developmental speech or language disorder     Open wound of wrist with complication     Mild intermittent asthma      PHQ-9 Screening:  PHQ-9 TOTAL SCORE 10/10/2016 12/25/2015 04/16/2011   Questionnaire Total Score - - 8   Doc FlowSheet Total Score 9 0 -       INTERVENTION: psychoeducation and empathic listening.    PLAN: patient to call as needed. She has the contact information.    Trudee Gripina Marieelena Bartko, MSW 10/17/2016, 1:42 PM

## 2017-04-20 ENCOUNTER — Other Ambulatory Visit (HOSPITAL_BASED_OUTPATIENT_CLINIC_OR_DEPARTMENT_OTHER): Payer: Self-pay | Admitting: Internal Medicine

## 2017-04-20 MED ORDER — NORGESTIMATE-ETH ESTRADIOL 0.25-35 MG-MCG PO TABS
ORAL_TABLET | ORAL | 3 refills | Status: DC
Start: 2017-04-20 — End: 2018-03-23

## 2017-04-20 MED ORDER — NORGESTIMATE-ETH ESTRADIOL 0.25-35 MG-MCG PO TABS: tablet | ORAL | 3 refills | 0 days | Status: DC

## 2017-04-20 NOTE — Progress Notes (Signed)
PER Patient (self), Casey [name] is a 36 year old female has requested a refill of PREVIFEM.      [facility]: [facility]  Last Physical Exam: 10/10/2016      Other Med Adult:  Most Recent BP Reading(s)  10/10/16 : (!) 134/93          Cholesterol (mg/dL)   Date Value   16/10/960410/27/2017 145   ----------    LOW DENSITY LIPOPROTEIN DIRECT (mg/dL)   Date Value   54/09/811910/27/2017 72   ----------    HIGH DENSITY LIPOPROTEIN (mg/dL)   Date Value   14/78/295610/27/2017 71   ----------    TRIGLYCERIDES (mg/dL)   Date Value   21/30/865710/27/2017 86   ----------        THYROID SCREEN TSH REFLEX FT4 (uIU/mL)   Date Value   09/07/2012 1.28   ----------      No results found for: TSH    No results found for: HGBA1C    No results found for: POCA1C        INR (no units)   Date Value   04/02/2010 0.9 (L)   ----------      SODIUM (mmol/L)   Date Value   03/19/2012 131 (L)   ----------      POTASSIUM (mmol/L)   Date Value   03/19/2012 3.9   ----------          CREATININE (mg/dl)   Date Value   84/69/629504/04/2012 1.0   ----------    Documented patient preferred pharmacies:    [facility]  Phone: [phone] Fax: [phone]

## 2017-04-20 NOTE — Progress Notes (Signed)
.  [facility]    Person calling on behalf of patient: Patient (self)    May list multiple medications in this section    Medicine Name: norgestimate-ethinyl estradiol (PREVIFEM) 0.25-35 MG-MCG per tablet    Dosage:     Frequency (how many pills, how many times a day):     Number of pills left:     Documented patient preferred pharmacies:   [facility]  Phone: [phone] Fax: [phone]              Cleotis LemaCALL BACK NUMBER: [phone]      Patient's language of care: TongaPortuguese ([place]ian)    Patient needs a TongaPortuguese interpreter.

## 2017-08-28 ENCOUNTER — Encounter (HOSPITAL_BASED_OUTPATIENT_CLINIC_OR_DEPARTMENT_OTHER): Payer: Self-pay | Admitting: Internal Medicine

## 2017-08-28 ENCOUNTER — Ambulatory Visit (HOSPITAL_BASED_OUTPATIENT_CLINIC_OR_DEPARTMENT_OTHER): Payer: Medicaid Other | Admitting: Internal Medicine

## 2017-08-28 VITALS — BP 126/67 | HR 95 | Temp 98.0°F | Wt 104.0 lb

## 2017-08-28 DIAGNOSIS — L84 Corns and callosities: Secondary | ICD-10-CM

## 2017-08-28 DIAGNOSIS — N760 Acute vaginitis: Principal | ICD-10-CM

## 2017-08-28 DIAGNOSIS — Z124 Encounter for screening for malignant neoplasm of cervix: Secondary | ICD-10-CM

## 2017-08-28 LAB — VAGINITIS PANEL (DNA PROBE)
CANDIDA SPECIES: NEGATIVE
GARDNERELLA VAGINALIS: POSITIVE
TRICHOMONAS VAGINALIS: NEGATIVE

## 2017-08-28 MED ORDER — SALICYLIC ACID 40 % EX PADS: 1 | each | CUTANEOUS | 1 refills | 0 days | Status: AC

## 2017-08-28 MED ORDER — SALICYLIC ACID 40 % EX PADS
1.0000 | MEDICATED_PAD | CUTANEOUS | 1 refills | Status: AC
Start: 2017-08-28 — End: 2017-09-11

## 2017-08-28 NOTE — Patient Instructions (Signed)
Put the pad (with medicine on your foot). Leave there for 2 days, then take it off.   Clean the foot - use a nail file if there is extra skin. Put on another pad.   Do this until the foot is better!

## 2017-08-28 NOTE — Progress Notes (Signed)
[facility]    CC: few issues    HPI: Casey [name] is a 36 year old female who presents to clinic for:    - pain/bump on bottom of R foot. Getting painful, painful to put any weight on it.   - been there 1 month  - never had before    - also having some vaginal itching, some white discharge. Usually gets this coming and going with her period, but this time hasn't gone away. All started with getting an implant at the dentist and needed antibiotics.     - due for pap in 2 weeks (not actually due for PE)    Review of systems:     Gen:No fevers or unexplained weight loss.   Neuro: No visual changes. No paresthesias or new headaches.   HEENT: No sore throat or ear ache.    Pulm: No cough. No dyspnea.  Cardiac: No chest pain on exertion.    GI: No abdominal pain or change in bowel habits.    Derm: No new rashes or skin changes.    Psych: No sadness or anxiety that interferes with day-to-day activities.   Allergy: No enlarged nodes.  No new itching, sneezing or wheezing.      Medications, Allergies, Problem list, and Social history were all reviewed and updated in Epic as appropriate.       Physical Exam:  BP 126/67 (Site: LA, Position: Sitting, Cuff Size: Reg)  Pulse 95  Temp 98 F (36.7 C) (Temporal)  Wt 47.2 kg (104 lb)  LMP 08/14/2017  SpO2 100%  BMI 21.74 kg/m2    Gen: well appearing female in no distress  Psych: mood is good, affect is bright with normal range, speech fluent and coherent  HEENT: PERRL, EOMI, no icterus.   Abd: soft, non-tender, non-distended  Pelvic:  Patient was placed in the lithotomy position.  Bright light examination reveals normal external genitalia. Normal urethra, perineum, and anus.  Speculum was introduced through the vaginal os.  Vaginal discharge was normal. The cervix was identified and a specimen was obtained for Pap smear- cervix without discharge or lesions.  Ext: on base of R foot is a hyperkeratotic growth, central hole, no telangictasias. Tender to the touch  Neuro:  strength and sensation grossly intact bilaterally. Gait is narrow-based and stable.  Skin: no rashes or lesions, no skin breakdown on lower extremities.      ASSESMENT/PLAN:  (Z12.4) Screening for cervical cancer  Comment:   Plan: CYTOPATH, C/V, THIN LAYER, OBTAINING SCREEN PAP        SMEAR, HUMAN PAPILLOMAVIRUS (HPV)            (N76.0) Vaginitis and vulvovaginitis  Comment: good story for yeast, though exam was unremarkable  Plan: VAGINITIS PANEL (DNA PROBE)        Will f/u on the panel    (L84) Corns and callosities  Comment: apperance more like a corn than a wart. Advised try to identify a new shoe that may have triggered this. Trial of topical therapy  Plan: Salicylic Acid 40 % PADS - instructions given, see pt instructions.         Referral for podiatry prn      The patient was ready to learn and no apparent learning or adherence barriers were identified. I explained the diagnosis and treatment plan, and the patient expressed understanding of the content. I attempted to answer any questions regarding the diagnosis and the proposed treatment.    Possible side  effects of the new prescribed medication were explained, including skin irritations. We discussed the patients current medications.  We discussed the importance of medication compliance. The patient expressed understanding and no barriers to adherence were identified.    she has been advised to call or return with any worsening or new problems        Lurena Joiner E. [name], MD

## 2017-08-29 ENCOUNTER — Telehealth (HOSPITAL_BASED_OUTPATIENT_CLINIC_OR_DEPARTMENT_OTHER): Payer: Self-pay | Admitting: Internal Medicine

## 2017-08-29 MED ORDER — METRONIDAZOLE 500 MG PO TABS
500.00 mg | ORAL_TABLET | Freq: Two times a day (BID) | ORAL | 0 refills | Status: AC
Start: 2017-08-29 — End: 2017-09-05

## 2017-08-29 MED ORDER — METRONIDAZOLE 500 MG PO TABS
500.0000 mg | ORAL_TABLET | Freq: Two times a day (BID) | ORAL | 0 refills | Status: DC
Start: 2017-08-29 — End: 2017-08-29

## 2017-08-29 MED ORDER — METRONIDAZOLE 500 MG PO TABS: 500 mg | tablet | Freq: Two times a day (BID) | ORAL | 0 refills | 0 days | Status: DC

## 2017-08-29 MED ORDER — METRONIDAZOLE 500 MG PO TABS: 500 mg | tablet | Freq: Two times a day (BID) | ORAL | 0 refills | 0 days | Status: AC

## 2017-08-29 NOTE — Progress Notes (Signed)
Called with vaginitis results - + for BV. She prefers oral tx - sent to [facility] pharmacy, she will pick up Monday. E prescription failed, but I called pharmacy and they will fill.

## 2017-08-31 LAB — HUMAN PAPILLOMAVIRUS (HPV): HUMAN PAPILLOMAVIRUS: NEGATIVE

## 2017-09-03 LAB — CYTOPATH, C/V, THIN LAYER

## 2017-09-04 ENCOUNTER — Encounter (HOSPITAL_BASED_OUTPATIENT_CLINIC_OR_DEPARTMENT_OTHER): Payer: Self-pay | Admitting: Internal Medicine

## 2017-12-11 ENCOUNTER — Telehealth (HOSPITAL_BASED_OUTPATIENT_CLINIC_OR_DEPARTMENT_OTHER): Payer: Self-pay

## 2017-12-11 NOTE — Progress Notes (Signed)
[facility]    Person calling on behalf of patient: Patient (self)    May list multiple medications in this section    Medicine Name: norgestimate-ethinyl estradiol     Dosage: Take 1 tablet by mouth Daily       Documented patient preferred pharmacies:   [facility], [place], [place] - [address]  Phone: [phone] Fax: [phone]        Patient's language of care: Tong[place]Portuguese ([place]ian)    Patient needs a TongaPortuguese interpreter.

## 2017-12-11 NOTE — Progress Notes (Signed)
Med on file with Pharmacy:  [facility] confirmed that [facility] has active refills remaining. No refill is required at this time. Next refill due 12/14/17.

## 2017-12-16 ENCOUNTER — Ambulatory Visit (HOSPITAL_BASED_OUTPATIENT_CLINIC_OR_DEPARTMENT_OTHER): Payer: Medicaid Other | Admitting: Internal Medicine

## 2017-12-16 ENCOUNTER — Encounter (HOSPITAL_BASED_OUTPATIENT_CLINIC_OR_DEPARTMENT_OTHER): Payer: Self-pay | Admitting: Internal Medicine

## 2017-12-16 VITALS — BP 137/75 | HR 89 | Temp 98.3°F | Ht <= 58 in | Wt 105.0 lb

## 2017-12-16 DIAGNOSIS — Z23 Encounter for immunization: Secondary | ICD-10-CM

## 2017-12-16 DIAGNOSIS — L84 Corns and callosities: Principal | ICD-10-CM

## 2017-12-16 MED ORDER — NAPROXEN 500 MG PO TABS: 500 mg | tablet | Freq: Two times a day (BID) | ORAL | 0 refills | 0 days | Status: AC

## 2017-12-16 MED ORDER — NAPROXEN 500 MG PO TABS
500.0000 mg | ORAL_TABLET | Freq: Two times a day (BID) | ORAL | 0 refills | Status: DC
Start: 2017-12-16 — End: 2020-07-03

## 2017-12-16 NOTE — Progress Notes (Signed)
Influenza Vaccine Procedure  December 16, 2017    1. Has the patient received the information for the influenza vaccine? Yes    2. Does the patient have any of the following contraindications?  Allergy to eggs? No  Allergic reaction to previous influenza vaccines? No  Any other problems to previous influenza vaccines? No  Paralyzed by Guillain-Barre syndrome?  No  Currently pregnant? No  Current moderate or severe illness? No  Allergy to contact lens solution? No    3. The vaccine has been administered in the usual fashion and the patient/guardian was instructed to wait 20 minutes before leaving the building in the event of an allergic reaction:     Immunization information and current VIS for flu vaccine(s) reviewed; verbal consent given by patient/guardian.

## 2017-12-16 NOTE — Progress Notes (Signed)
[facility]    CC: Patient presents with:  Foot Pain: corn on bottom of foot getting worse      HISTORY OF PRESENT ILLNESS:     Casey [name] is a 37 year old woman who presents for:    Corn x 6 months. Sole of right foot on ball of foot. Got bigger and bigger. Taking nothing for pain. Can't wear high heels. Pain sometimes a 9. Never had this before. No running or other intense exercise. Given Dr. Margart SicklesScholl's -- didn't help.    Her primary care provider is Donnel SaxonRebecca E. Aundria Rudogers, MD, MD.    History:    Past medical history, social history, and family history reviewed and updated in EMR. Notable for developmental speech disorder        Medications: See below.    Allergies: Reviewed and updated in EMR.      PHYSICAL EXAM:    BP 137/75 (Site: RA, Position: Sitting, Cuff Size: Reg)  Pulse 89  Temp 98.3 F (36.8 C) (Temporal)  Ht 4\' 10"  (1.473 m)  Wt 47.6 kg (105 lb)  LMP 11/30/2017 (Within Days)  SpO2 100%  BMI 21.95 kg/m2  SKIN: on the ball of the right foot there is a yellow hyperkeratotic lesion with central built-up area    ASSESSMENT AND PLAN:    Casey [name] is a 37 year old woman who presents with the following:    Corns and callosities  -     REFERRAL TO PODIATRY ( INT)  -     naproxen (NAPROSYN) 500 MG tablet; Take 1 tablet by mouth 2 (two) times daily with meals and Tylenol for pain    The risks and benefits, including side effects, of NSAIDs were identified and discussed. Given the short course, we specifically addressed the risk for gastritis and reflux, and I advised her to call the clinic if she has any such symptoms. No learning or adherence barriers were identified. she was advised to call the clinic if she experiences any side effects or other concerning symptoms.      Needs flu shot  -     IIV4 VACC PRESRV FREE 3 YRS & OLDER IM USE    I explained the assessment and plan for treatment, and she expressed understanding. I answered all questions. No learning or adherence  barriers were identified.    Return to clinic PRN    Medications at the end of encounter:       Medication List           Accurate as of 12/16/17 11:57 AM. If you have any questions, ask your nurse or doctor.               START taking these medications    naproxen 500 MG tablet  Commonly known as:  NAPROSYN  Take 1 tablet by mouth 2 (two) times daily with meals  Started by:  Darrick PennaJessamyn Rekisha Welling, MD        CONTINUE taking these medications    albuterol 108 (90 Base) MCG/ACT inhaler  Commonly known as:  PROAIR HFA  Inhale 2 puffs into the lungs every 6 (six) hours as needed for Wheezing or Shortness of breath (when exposed to cat)     diphenhydrAMINE 25 MG capsule  Commonly known as:  BENADRYL     fluticasone 220 MCG/ACT inhaler  Commonly known as:  FLOVENT HFA  Inhale 2 puffs into the lungs 2 (two) times daily  montelukast 10 MG tablet  Commonly known as:  SINGULAIR  Take 1 tablet by mouth nightly.     norgestimate-ethinyl estradiol 0.25-35 MG-MCG per tablet  Commonly known as:  PREVIFEM  TAKE 1 TABLET BY MOUTH DAILY.           Where to Get Your Medications      These medications were sent to [facility], [place], [place] - [address]  [address] [place] [address]    Phone:  [phone]    naproxen 500 MG tablet

## 2017-12-22 ENCOUNTER — Ambulatory Visit (HOSPITAL_BASED_OUTPATIENT_CLINIC_OR_DEPARTMENT_OTHER): Payer: Medicaid Other | Admitting: Podiatrist

## 2017-12-22 ENCOUNTER — Encounter (HOSPITAL_BASED_OUTPATIENT_CLINIC_OR_DEPARTMENT_OTHER): Payer: Self-pay | Admitting: Podiatrist

## 2017-12-22 DIAGNOSIS — B070 Plantar wart: Principal | ICD-10-CM

## 2017-12-22 NOTE — Progress Notes (Signed)
Podiatric medicine and surgery note    CHIEF COMPLAINT    Patient presents with:  Referral: Corn on right sole of foot -- ball of foot      HPI    Casey [name] is a 37 year old female who presents with symptomatic hyperkeratosis on the inferior aspect of the first metatarsal head right foot for 6 months in duration.  She cannot identify any precipitating factors.  She denies any history of penetrating injury.  She endorses pain with weightbearing.  There has been no notable improvement in this lesion.    REVIEW OF SYSTEMS    See HPI for further details. Review of systems otherwise negative.     PAST MEDICAL HISTORY  Past Medical History:   Diagnosis Date    HTN (hypertension)     Irregular menstrual cycle 06/17/2007    Pelvic pain 06/17/2007    Pre-eclampsia        SURGICAL HISTORY  Past Surgical History:  No date: OB ANTEPARTUM CARE CESAREAN DLVR & POSTPARTUM      Comment:  age 89  No date: TONSILLECTOMY ONE-HALF AGE 73/>  No date: UNLISTED PROCEDURE PHARYNX ADENOIDS/TONSILS      Comment:  age  89    CURRENT MEDICATIONS      Current Outpatient Medications:     naproxen (NAPROSYN) 500 MG tablet, Take 1 tablet by mouth 2 (two) times daily with meals, Disp: 60 tablet, Rfl: 0    norgestimate-ethinyl estradiol (PREVIFEM) 0.25-35 MG-MCG per tablet, TAKE 1 TABLET BY MOUTH DAILY., Disp: 84 tablet, Rfl: 3    albuterol (PROAIR HFA) 108 (90 Base) MCG/ACT inhaler, Inhale 2 puffs into the lungs every 6 (six) hours as needed for Wheezing or Shortness of breath (when exposed to cat), Disp: 1 Inhaler, Rfl: 3    fluticasone (FLOVENT HFA) 220 MCG/ACT inhaler, Inhale 2 puffs into the lungs 2 (two) times daily, Disp: 1 Inhaler, Rfl: 0    diphenhydrAMINE (BENADRYL) 25 MG capsule, Take 25 mg by mouth every 6 (six) hours as needed for Itching., Disp: , Rfl:     montelukast (SINGULAIR) 10 MG tablet, Take 1 tablet by mouth nightly., Disp: 30 tablet, Rfl: 2    ALLERGIES    Review of Patient's Allergies indicates:   Cat dander               Shortness of Breath    Comment:Improved with albuterol.    SOCIAL HISTORY  Social History     Socioeconomic History    Marital status: Single     Spouse name: Not on file    Number of children: 1    Years of education: Not on file    Highest education level: Not on file   Social Needs    Financial resource strain: Not on file    Food insecurity - worry: Not on file    Food insecurity - inability: Not on file    Transportation needs - medical: Not on file    Transportation needs - non-medical: Not on file   Occupational History    Occupation: housecleaning   Tobacco Use    Smoking status: Light Tobacco Smoker     Packs/day: 0.25     Types: Cigarettes    Smokeless tobacco: Current User    Tobacco comment: 2 cigs/day   Substance and Sexual Activity    Alcohol use: Yes     Alcohol/week: 0.0 oz     Comment: special occasions    Drug use: No  Sexual activity: Yes     Partners: Male     Birth control/protection: IUD   Other Topics Concern    Military Service Not Asked    Blood Transfusions Not Asked    Caffeine Concern No     Comment: 1 cup coffee/day    Occupational Exposure Not Asked    Hobby Hazards Not Asked    Sleep Concern No    Stress Concern No    Weight Concern Not Asked    Special Diet Not Asked    Back Care Not Asked    Exercise Yes    Bike Helmet Not Asked    Seat Belt Not Asked    Self-Exams Yes   Social History Narrative    2017: living with partner of 15 year. Son in [place], living with her mother and sister. 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        FAMILY HISTORY  Family History   Problem Relation Age of Onset    In Good Health Mother     In Good Health Father     Diabetes FamHxNeg     Heart Maternal Grandfather     Heart Maternal Uncle         age 60    Cancer - Lung Paternal Uncle         non-smoker       PHYSICAL EXAM    Vital Signs: LMP 11/30/2017 (Within Days)  Presents afebrile and hemodynamically  stabile  Constitutional:  Well-developed, Well-nourished, No acute distress, Non-toxic appearance.   Cardiovascular:  Exhibits a warm and well-perfused foot bilateral.  Capillary filling time is immediate.  There is no pitting edema.  Distal pulses are palpable.  There are no prominent varicosities.  There is no calf tenderness.  Musculoskeletal:  There is no notable skeletal derangement to either foot  or ankle. There is no asymmetry is muscle mass strength or tone to either lower extremity. Manual muscle testing confirms 5/5 strength for all lower extremity groups.  Skin: There is a well-circumscribed raised hyperkeratotic lesion underlying the first metatarsal head of the right foot.  Sharp debridement of this tissue confirms a defined papular lesion with disturbance of normal skin lines and neovascularization consistent with plantar verruca.  It is tender with direct palpation.  Lymphatic:  No lymphadenopathy noted.   Neurological: Gait normal. Reflexes normal and symmetric. Sensation grossly normal  Psychiatric:  interactive with questions        RADIOLOGY  None    LABS  None    PROCEDURES   sharp debridement of the hyperkeratotic tissue to expose the underlying verruca was entertained.  Utilization of liquid nitrogen this was exposed to a freeze and frost technique x3 followed by application of Cantharone under occlusion.  This was well-tolerated.      FINAL IMPRESSION  Plantar wart, right foot    I did have discussion with the patient regarding the condition on his a plantars wart.  We did discuss the signs and symptoms, risk factors, prognosis and treatment alternatives.  I did explain destructive measures for management of this to include cryotherapy.  The perioperative course was thoroughly discussed.  Risks and benefits were outlined.  All questions were answered.  No guarantees were given.  Patient endorses understanding but would like to proceed.  Verbal consent was obtained.  Identified procedure  entertained without incident.    FOLLOW-UP  Follow up in 3 week(s)  A majority of this note has been dictated with a voice recognition system. Occasional wrong-word or "sound-A-like" substitutions may have occurred due to the inherent limitations of voice recognition software. Read the chart carefully and recognize, using context, where substitutions have occurred. Please excuse any identified errors in spelling or syntax. Every effort has been made to appropriately edit and correct upon completion.      Electronically signed by: Doralee Albino. Nahima Ales,DPM, 12/22/2017 3:04 PM

## 2018-01-12 ENCOUNTER — Ambulatory Visit (HOSPITAL_BASED_OUTPATIENT_CLINIC_OR_DEPARTMENT_OTHER): Payer: Medicaid Other | Admitting: Podiatrist

## 2018-01-12 ENCOUNTER — Encounter (HOSPITAL_BASED_OUTPATIENT_CLINIC_OR_DEPARTMENT_OTHER): Payer: Self-pay | Admitting: Podiatrist

## 2018-01-12 DIAGNOSIS — B070 Plantar wart: Principal | ICD-10-CM

## 2018-01-12 NOTE — Progress Notes (Signed)
Subjective:    Casey [name] is a 37 year old who returns to clinic for evaluation of a plantar verruca on the right lower extremity.  She was seen 3 weeks ago, and at that time was treated with cryotherapy as well as Cantharone.  She has done well with this, and relates no significant pain to the site.  She does have complaints of secondary pain to the forefoot, and is unsure if she has another wart forming.    ROS: Denies any constitutional complaints.    Objective:  General: Patient is pleasant and in no apparent distress, AOx3.  English utilized effectively for this visit.    Lower Extremity Exam  Vascular: Dorsalis pedis and posterior tibial pulses are palpable bilaterally. Capillary refill time is brisk to the digits. There is no significant edema or increase in skin temperature gradient noted bilaterally.  Ortho: Muscle strength is 5 out of 5 to the right lower extremity.  No tenderness palpation to the peri-verruca region.  There is also no tenderness to palpation to the rest of the forefoot, despite the patient's description of discomfort in this area.  Neuro: Sensation is grossly intact to level digits via light touch.  Derm: There is an area of hyperkeratotic tissue to the plantar aspect of the first metatarsal head with thrombosed capillaries appreciated.  With debridement of the hyperkeratotic tissue, there is some mild absence of skin lines appreciated though no frank verrucoid appearing tissue.  No satellite lesions are noted.    Assessment:  Plantar verruca, right    Plan:  The patient seems to have responded extremely favorably towards the first cryotherapy and Cantharone treatment.  The lesion was significantly reduced in size in comparison to previous examination.  However, in order to prevent recurrence, we did go ahead and proceed with additional cryotherapy and Cantharone.  She will return to clinic in 3 weeks for reevaluation.    This note was created using dictation software. Please excuse  any errors in spelling, grammar or syntax. Thank you for your understanding.

## 2018-01-12 NOTE — Progress Notes (Signed)
Podiatric medicine and surgery note    CHIEF COMPLAINT    Patient presents with:  Plantar Warts: 3 week f/u      HPI    Casey [name] is a 37 year old female who presents for follow-up regarding identified plantar verruca of right foot.  On last visit I did perform sharp debridement with cryotherapy and application of Cantharone under occlusion.  She does not comment on adverse response to treatment.  She endorses what appears to be improvement in presentation.    REVIEW OF SYSTEMS    See HPI for further details. Review of systems otherwise negative.     PAST MEDICAL HISTORY  Past Medical History:   Diagnosis Date    HTN (hypertension)     Irregular menstrual cycle 06/17/2007    Pelvic pain 06/17/2007    Pre-eclampsia        SURGICAL HISTORY  Past Surgical History:  No date: OB ANTEPARTUM CARE CESAREAN DLVR & POSTPARTUM      Comment:  age 27  No date: TONSILLECTOMY ONE-HALF AGE 46/>  No date: UNLISTED PROCEDURE PHARYNX ADENOIDS/TONSILS      Comment:  age  86    CURRENT MEDICATIONS      Current Outpatient Medications:     naproxen (NAPROSYN) 500 MG tablet, Take 1 tablet by mouth 2 (two) times daily with meals, Disp: 60 tablet, Rfl: 0    norgestimate-ethinyl estradiol (PREVIFEM) 0.25-35 MG-MCG per tablet, TAKE 1 TABLET BY MOUTH DAILY., Disp: 84 tablet, Rfl: 3    albuterol (PROAIR HFA) 108 (90 Base) MCG/ACT inhaler, Inhale 2 puffs into the lungs every 6 (six) hours as needed for Wheezing or Shortness of breath (when exposed to cat), Disp: 1 Inhaler, Rfl: 3    fluticasone (FLOVENT HFA) 220 MCG/ACT inhaler, Inhale 2 puffs into the lungs 2 (two) times daily, Disp: 1 Inhaler, Rfl: 0    diphenhydrAMINE (BENADRYL) 25 MG capsule, Take 25 mg by mouth every 6 (six) hours as needed for Itching., Disp: , Rfl:     montelukast (SINGULAIR) 10 MG tablet, Take 1 tablet by mouth nightly., Disp: 30 tablet, Rfl: 2    ALLERGIES    Review of Patient's Allergies indicates:   Cat dander              Shortness of Breath     Comment:Improved with albuterol.    SOCIAL HISTORY  Social History     Socioeconomic History    Marital status: Single     Spouse name: Not on file    Number of children: 1    Years of education: Not on file    Highest education level: Not on file   Social Needs    Financial resource strain: Not on file    Food insecurity - worry: Not on file    Food insecurity - inability: Not on file    Transportation needs - medical: Not on file    Transportation needs - non-medical: Not on file   Occupational History    Occupation: housecleaning   Tobacco Use    Smoking status: Light Tobacco Smoker     Packs/day: 0.25     Types: Cigarettes    Smokeless tobacco: Current User    Tobacco comment: 2 cigs/day   Substance and Sexual Activity    Alcohol use: Yes     Alcohol/week: 0.0 oz     Comment: special occasions    Drug use: No    Sexual activity: Yes  Partners: Male     Birth control/protection: IUD   Other Topics Theatre stage managerConcern    Military Service Not Asked    Blood Transfusions Not Asked    Caffeine Concern No     Comment: 1 cup coffee/day    Occupational Exposure Not Asked    Hobby Hazards Not Asked    Sleep Concern No    Stress Concern No    Weight Concern Not Asked    Special Diet Not Asked    Back Care Not Asked    Exercise Yes    Bike Helmet Not Asked    Seat Belt Not Asked    Self-Exams Yes   Social History Narrative    2017: living with partner of 15 year. Son in [place], living with her mother and sister. 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        FAMILY HISTORY  Family History   Problem Relation Age of Onset    In Good Health Mother     In Good Health Father     Diabetes FamHxNeg     Heart Maternal Grandfather     Heart Maternal Uncle         age 37    Cancer - Lung Paternal Uncle         non-smoker       PHYSICAL EXAM    Vital Signs:   Presents afebrile and hemodynamically stabile  Constitutional:  Well-developed, Well-nourished, No acute  distress, Non-toxic appearance.   Cardiovascular:  Exhibits a warm and well-perfused foot bilateral.  Capillary filling time is immediate.  There is no pitting edema.  Distal pulses are palpable.  There are no prominent varicosities.  There is no calf tenderness.  Musculoskeletal:  There is no notable skeletal derangement to either foot  or ankle. There is no asymmetry is muscle mass strength or tone to either lower extremity. Manual muscle testing confirms 5/5 strength for all lower extremity groups.  Skin: Sharp debridement of the recurrent hyperkeratotic tissue underlying the first metatarsal head right was performed with demonstration of notable contracture of identified verruca on last visit.  There are minimal skin changes at this time however there is some continued papular changes noted.  There is minimal tenderness with compression.  Lymphatic:  No lymphadenopathy noted.   Neurological: Gait normal. Reflexes normal and symmetric. Sensation grossly normal  Psychiatric:  interactive with questions        RADIOLOGY  None    LABS  None    PROCEDURES   sharp debridement of the hyperkeratotic tissue underlying the first metatarsal head right foot was entertained without incident.  We again employed cryotherapy in a freeze and frost technique x3 with application of Cantharone under occlusion.  This was well-tolerated.  Warm      FINAL IMPRESSION  Plantar wart, right foot  (primary encounter diagnosis)    No order to minimize risk of relapse we did go ahead and perform subsequent treatment today.  I anticipate she will not need further treatment based on current presentation.    FOLLOW-UP  Follow up in 3 week(s)      A majority of this note has been dictated with a voice recognition system. Occasional wrong-word or "sound-A-like" substitutions may have occurred due to the inherent limitations of voice recognition software. Read the chart carefully and recognize, using context, where substitutions have occurred.  Please excuse any identified errors in spelling or syntax. Every effort has  been made to appropriately edit and correct upon completion.      Electronically signed by: Doralee Albino. Desirea Mizrahi,DPM, 01/12/2018 9:12 AM

## 2018-02-02 ENCOUNTER — Encounter (HOSPITAL_BASED_OUTPATIENT_CLINIC_OR_DEPARTMENT_OTHER): Payer: Self-pay | Admitting: Podiatrist

## 2018-02-02 ENCOUNTER — Ambulatory Visit (HOSPITAL_BASED_OUTPATIENT_CLINIC_OR_DEPARTMENT_OTHER): Payer: Medicaid Other | Admitting: Podiatrist

## 2018-02-02 DIAGNOSIS — B070 Plantar wart: Principal | ICD-10-CM

## 2018-02-02 NOTE — Progress Notes (Signed)
Subjective:    Casey [name] is a 37 year old female presents for follow-up of her verruca on her plantar aspect of the right first metatarsal head.  She has undergone multiple debridements with application of cryotherapy and Cantharone.  Her last treatment was 3 weeks ago.  She denies any significant pain.  She believes that the wart is now fully resolved.    ROS: Denies any constitutional complaints.    Objective:  General: Patient is pleasant and in no apparent distress, AOx3.  English utilized effectively for this visit.    Lower Extremity Exam  Vascular: Dorsalis pedis and posterior tibial pulses are palpable bilaterally. Capillary refill time is brisk to the digits. There is no significant edema or increase in skin temperature gradient noted bilaterally.  Ortho: Muscle strength is 5 out of 5 to the right lower extremity.  There is no tenderness to palpation to the plantar aspect of the right first metatarsal head.  Neuro: Sensation is intact to level digits via light touch.  Derm: With reduction of the overlying hyperkeratotic tissue, the area underlying the first metatarsal head now appears to be fully resolved with the [facility] tissue.  There is return of skin lines.  There is no surrounding erythema or notable drainage.    Assessment:  Plantar verruca, resolved    Plan:  With serial treatment, the verruca now appears to be fully resolved.  The patient is discharged from active care.  She was informed that the wart may return, and that she may pursue further treatment if necessary.  She endorses understanding.    This note was created using dictation software. Please excuse any errors in spelling, grammar or syntax. Thank you for your understanding.

## 2018-02-02 NOTE — Progress Notes (Signed)
Podiatric medicine and surgery note    CHIEF COMPLAINT    Patient presents with:  Plantar Warts: right foot f/u      HPI    Casey [name] is a 37 year old female who presents for follow-up regarding plantar verruca of right forefoot.  She has undergone serial debridement with cryotherapy and application of Cantharone.  She does not comment on any adverse process or reaction to care.  She does not endorse any pain to the region.    REVIEW OF SYSTEMS    See HPI for further details. Review of systems otherwise negative.     PAST MEDICAL HISTORY  Past Medical History:   Diagnosis Date    HTN (hypertension)     Irregular menstrual cycle 06/17/2007    Pelvic pain 06/17/2007    Pre-eclampsia        SURGICAL HISTORY  Past Surgical History:  No date: OB ANTEPARTUM CARE CESAREAN DLVR & POSTPARTUM      Comment:  age 37  No date: TONSILLECTOMY ONE-HALF AGE 37/>  No date: UNLISTED PROCEDURE PHARYNX ADENOIDS/TONSILS      Comment:  age  89    CURRENT MEDICATIONS      Current Outpatient Medications:     naproxen (NAPROSYN) 500 MG tablet, Take 1 tablet by mouth 2 (two) times daily with meals, Disp: 60 tablet, Rfl: 0    norgestimate-ethinyl estradiol (PREVIFEM) 0.25-35 MG-MCG per tablet, TAKE 1 TABLET BY MOUTH DAILY., Disp: 84 tablet, Rfl: 3    albuterol (PROAIR HFA) 108 (90 Base) MCG/ACT inhaler, Inhale 2 puffs into the lungs every 6 (six) hours as needed for Wheezing or Shortness of breath (when exposed to cat), Disp: 1 Inhaler, Rfl: 3    fluticasone (FLOVENT HFA) 220 MCG/ACT inhaler, Inhale 2 puffs into the lungs 2 (two) times daily, Disp: 1 Inhaler, Rfl: 0    diphenhydrAMINE (BENADRYL) 25 MG capsule, Take 25 mg by mouth every 6 (six) hours as needed for Itching., Disp: , Rfl:     montelukast (SINGULAIR) 10 MG tablet, Take 1 tablet by mouth nightly., Disp: 30 tablet, Rfl: 2    ALLERGIES    Review of Patient's Allergies indicates:   Cat dander              Shortness of Breath    Comment:Improved with albuterol.    SOCIAL  HISTORY  Social History     Socioeconomic History    Marital status: Single     Spouse name: Not on file    Number of children: 1    Years of education: Not on file    Highest education level: Not on file   Social Needs    Financial resource strain: Not on file    Food insecurity - worry: Not on file    Food insecurity - inability: Not on file    Transportation needs - medical: Not on file    Transportation needs - non-medical: Not on file   Occupational History    Occupation: housecleaning   Tobacco Use    Smoking status: Light Tobacco Smoker     Packs/day: 0.25     Types: Cigarettes    Smokeless tobacco: Current User    Tobacco comment: 2 cigs/day   Substance and Sexual Activity    Alcohol use: Yes     Alcohol/week: 0.0 oz     Comment: special occasions    Drug use: No    Sexual activity: Yes     Partners: Male  Birth control/protection: IUD   Other Topics Concern    Military Service Not Asked    Blood Transfusions Not Asked    Caffeine Concern No     Comment: 1 cup coffee/day    Occupational Exposure Not Asked    Hobby Hazards Not Asked    Sleep Concern No    Stress Concern No    Weight Concern Not Asked    Special Diet Not Asked    Back Care Not Asked    Exercise Yes    Bike Helmet Not Asked    Seat Belt Not Asked    Self-Exams Yes   Social History Narrative    2017: living with partner of 15 year. Son in [place], living with her mother and sister. 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        FAMILY HISTORY  Family History   Problem Relation Age of Onset    In Good Health Mother     In Good Health Father     Diabetes FamHxNeg     Heart Maternal Grandfather     Heart Maternal Uncle         age 24    Cancer - Lung Paternal Uncle         non-smoker       PHYSICAL EXAM    Vital Signs:   Presents afebrile and hemodynamically stabile  Constitutional:  Well-developed, Well-nourished, No acute distress, Non-toxic appearance.    Cardiovascular:  Exhibits a warm and well-perfused foot bilateral.  Capillary filling time is immediate.  There is no pitting edema.  Distal pulses are palpable.  There are no prominent varicosities.  There is no calf tenderness.  Musculoskeletal:  There is no notable skeletal derangement to either foot  or ankle. There is no asymmetry is muscle mass strength or tone to either lower extremity. Manual muscle testing confirms 5/5 strength for all lower extremity groups.  Skin: There appears to be resolution of the plantar verruca right foot with no residual papular changes and return of normal skin lines.  There is no tenderness underlying the first metatarsal head of the right foot.  Lymphatic:  No lymphadenopathy noted.   Neurological: Gait normal. Reflexes normal and symmetric. Sensation grossly normal  Psychiatric:  interactive with questions        RADIOLOGY  None    LABS  None    PROCEDURES  None      FINAL IMPRESSION  Plantar wart, right foot  (primary encounter diagnosis)    Patient appears to have had clinical resolution of this verruca.  She is discharged from active management.  If there is any relapse of symptoms he will contact us immediately.    FOLLOW-UP  No follow up scheduled at this time      A majority of this note has been dictated with a voice recognition system. Occasional wrong-word or "sound-A-like" substitutions may have occurred due to the inherent limitations of voice recognition software. Read the chart carefully and recognize, using context, where substitutions have occurred. Please excuse any identified errors in spelling or syntax. Every effort has been made to appropriately edit and correct upon completion.      Electronically signed by: Doralee Albino. Adyn Hoes,DPM, 02/02/2018 8:50 AM

## 2018-03-23 ENCOUNTER — Other Ambulatory Visit (HOSPITAL_BASED_OUTPATIENT_CLINIC_OR_DEPARTMENT_OTHER): Payer: Self-pay | Admitting: Internal Medicine

## 2018-03-23 MED ORDER — NORGESTIMATE-ETH ESTRADIOL 0.25-35 MG-MCG PO TABS: tablet | ORAL | 3 refills | 0 days | Status: AC

## 2018-03-23 MED ORDER — NORGESTIMATE-ETH ESTRADIOL 0.25-35 MG-MCG PO TABS
ORAL_TABLET | ORAL | 3 refills | Status: DC
Start: 2018-03-23 — End: 2019-05-27

## 2018-03-23 NOTE — Progress Notes (Signed)
PER Patient (self), Casey [name] is a 37 year old female has requested a refill of previfem.      Last Office Visit: 12/16/17 with blau  Last Physical Exam: 10/10/16    There are no preventive care reminders to display for this patient.    Other Med Adult:  Most Recent BP Reading(s)  12/16/17 : 137/75        Cholesterol (mg/dL)   Date Value   16/10/960410/27/2017 145     LOW DENSITY LIPOPROTEIN DIRECT (mg/dL)   Date Value   54/09/811910/27/2017 72     HIGH DENSITY LIPOPROTEIN (mg/dL)   Date Value   14/78/295610/27/2017 71     TRIGLYCERIDES (mg/dL)   Date Value   21/30/865710/27/2017 86         THYROID SCREEN TSH REFLEX FT4 (uIU/mL)   Date Value   09/07/2012 1.28         No results found for: TSH    No results found for: HGBA1C    No results found for: POCA1C      INR (no units)   Date Value   04/02/2010 0.9 (L)       SODIUM (mmol/L)   Date Value   03/19/2012 131 (L)       POTASSIUM (mmol/L)   Date Value   03/19/2012 3.9           CREATININE (mg/dl)   Date Value   84/69/629504/04/2012 1.0       Documented patient preferred pharmacies:    [facility], [place], [place] - [address]  Phone: [phone] Fax: [phone]    [id number] [place],  - [address]  Phone: [phone] Fax: [phone]

## 2018-03-23 NOTE — Progress Notes (Signed)
[facility]    Person calling on behalf of patient: Patient (self)    May list multiple medications in this section    Medicine Name: norgestimate-ethinyl estradiol (PREVIFEM) 0.25-35 MG-MCG per tablet      Documented patient preferred pharmacies:   [facility] [place], [place] - [address]  Phone: [phone] Fax: [phone]            Cleotis LemaCALL BACK NUMBER: [phone]         Patient's language of care: Tong[place]Portuguese ([place])    Patient needs a TongaPortuguese interpreter.

## 2019-05-26 ENCOUNTER — Other Ambulatory Visit (HOSPITAL_BASED_OUTPATIENT_CLINIC_OR_DEPARTMENT_OTHER): Payer: Self-pay | Admitting: Internal Medicine

## 2019-05-26 MED ORDER — ALBUTEROL SULFATE HFA 108 (90 BASE) MCG/ACT IN AERS
2.0000 | INHALATION_SPRAY | Freq: Four times a day (QID) | RESPIRATORY_TRACT | 3 refills | Status: DC | PRN
Start: 2019-05-26 — End: 2021-03-04

## 2019-05-26 MED FILL — ALBUTEROL HFA INH 90 MCG: 25 days supply | Qty: 9 | Fill #0 | Status: CP

## 2019-05-26 MED FILL — ALBUTEROL HFA INH 90 MCG: 25 days supply | Qty: 9 | Fill #0

## 2019-05-26 NOTE — Progress Notes (Signed)
PER Patient (self), Casey [name] is a 38 year old female has requested a refill of albuterol inhaler .      Last Office Visit:  12/16/17 with Vassie Moment  Last Physical Exam: 10/10/16    There are no preventive care reminders to display for this patient.    Other Med Adult:  Most Recent BP Reading(s)  12/16/17 : 137/75        Cholesterol (mg/dL)   Date Value   10/10/2016 145     LOW DENSITY LIPOPROTEIN DIRECT (mg/dL)   Date Value   10/10/2016 72     HIGH DENSITY LIPOPROTEIN (mg/dL)   Date Value   10/10/2016 71     TRIGLYCERIDES (mg/dL)   Date Value   10/10/2016 86         THYROID SCREEN TSH REFLEX FT4 (uIU/mL)   Date Value   09/07/2012 1.28         No results found for: TSH    No results found for: HGBA1C    No results found for: POCA1C      INR (no units)   Date Value   04/02/2010 0.9 (L)       SODIUM (mmol/L)   Date Value   03/19/2012 131 (L)       POTASSIUM (mmol/L)   Date Value   03/19/2012 3.9           CREATININE (mg/dl)   Date Value   03/19/2012 1.0       Documented patient preferred pharmacies:    [facility], [place], [place] - [place]  Phone: [phone] Fax: [phone]

## 2019-05-27 ENCOUNTER — Other Ambulatory Visit (HOSPITAL_BASED_OUTPATIENT_CLINIC_OR_DEPARTMENT_OTHER): Payer: Self-pay | Admitting: Internal Medicine

## 2019-05-27 MED FILL — ESTARYLLA 0.25-35: 84 days supply | Qty: 84 | Fill #0 | Status: CP

## 2019-05-27 NOTE — Progress Notes (Signed)
PER Pharmacy, Casey [name] is a 38 year old female has requested a refill of birthcontrol.      Last Office Visit: 12/16/2017 with Madilyn Fireman blau  Last Physical Exam: 10/10/2016      Other Med Adult:  Most Recent BP Reading(s)  12/16/17 : 137/75        Cholesterol (mg/dL)   Date Value   10/10/2016 145     LOW DENSITY LIPOPROTEIN DIRECT (mg/dL)   Date Value   10/10/2016 72     HIGH DENSITY LIPOPROTEIN (mg/dL)   Date Value   10/10/2016 71     TRIGLYCERIDES (mg/dL)   Date Value   10/10/2016 86         THYROID SCREEN TSH REFLEX FT4 (uIU/mL)   Date Value   09/07/2012 1.28         No results found for: TSH    No results found for: HGBA1C    No results found for: POCA1C      INR (no units)   Date Value   04/02/2010 0.9 (L)       SODIUM (mmol/L)   Date Value   03/19/2012 131 (L)       POTASSIUM (mmol/L)   Date Value   03/19/2012 3.9           CREATININE (mg/dl)   Date Value   03/19/2012 1.0       Documented patient preferred pharmacies:    [place], [place], [place] - [place]  Phone: [phone] Fax: [phone]

## 2019-09-19 MED FILL — ESTARYLLA 0.25-35: 84 days supply | Qty: 84 | Fill #1 | Status: CP

## 2019-09-19 MED FILL — FLUARIX QUAD INJ 2020-21: 1 days supply | Qty: 1 | Fill #0 | Status: CP

## 2020-06-05 ENCOUNTER — Other Ambulatory Visit (HOSPITAL_BASED_OUTPATIENT_CLINIC_OR_DEPARTMENT_OTHER): Payer: Self-pay | Admitting: Internal Medicine

## 2020-06-05 MED FILL — ESTARYLLA 0.25-35: 84 days supply | Qty: 84 | Fill #0 | Status: CP

## 2020-06-05 NOTE — Telephone Encounter (Signed)
PER Patient (self), Casey [name] is a 39 year old female has requested a refill of Birth Control.      Last Office Visit: 12/16/17 with Blau,J  Last Physical Exam: 10/10/16    There are no preventive care reminders to display for this patient.    Other Med Adult:  Most Recent BP Reading(s)  12/16/17 : 137/75        Cholesterol (mg/dL)   Date Value   49/82/6415 145     LOW DENSITY LIPOPROTEIN DIRECT (mg/dL)   Date Value   83/08/4075 72     HIGH DENSITY LIPOPROTEIN (mg/dL)   Date Value   80/88/1103 71     TRIGLYCERIDES (mg/dL)   Date Value   15/94/5859 86         THYROID SCREEN TSH REFLEX FT4 (uIU/mL)   Date Value   09/07/2012 1.28         No results found for: TSH    No results found for: HGBA1C    No results found for: POCA1C      INR (no units)   Date Value   04/02/2010 0.9 (L)       SODIUM (mmol/L)   Date Value   03/19/2012 131 (L)       POTASSIUM (mmol/L)   Date Value   03/19/2012 3.9           CREATININE (mg/dl)   Date Value   29/24/4628 1.0       Documented patient preferred pharmacies:    [facility], [place], [place] - [address]  Phone: [phone] Fax: [phone]

## 2020-07-03 ENCOUNTER — Ambulatory Visit: Payer: No Typology Code available for payment source | Attending: Internal Medicine | Admitting: Internal Medicine

## 2020-07-03 ENCOUNTER — Other Ambulatory Visit: Payer: Self-pay

## 2020-07-03 ENCOUNTER — Encounter (HOSPITAL_BASED_OUTPATIENT_CLINIC_OR_DEPARTMENT_OTHER): Payer: Self-pay | Admitting: Internal Medicine

## 2020-07-03 VITALS — BP 135/87 | HR 101 | Temp 97.2°F | Ht <= 58 in | Wt 104.4 lb

## 2020-07-03 DIAGNOSIS — Z23 Encounter for immunization: Secondary | ICD-10-CM

## 2020-07-03 DIAGNOSIS — G8929 Other chronic pain: Secondary | ICD-10-CM | POA: Diagnosis present

## 2020-07-03 DIAGNOSIS — R4582 Worries: Secondary | ICD-10-CM

## 2020-07-03 DIAGNOSIS — M25562 Pain in left knee: Secondary | ICD-10-CM | POA: Diagnosis present

## 2020-07-03 DIAGNOSIS — Z0000 Encounter for general adult medical examination without abnormal findings: Secondary | ICD-10-CM | POA: Diagnosis present

## 2020-07-03 DIAGNOSIS — M25561 Pain in right knee: Secondary | ICD-10-CM | POA: Diagnosis present

## 2020-07-03 DIAGNOSIS — R5383 Other fatigue: Secondary | ICD-10-CM | POA: Diagnosis present

## 2020-07-03 LAB — CBC WITH PLATELET
ABSOLUTE NRBC COUNT: 0 10*3/uL (ref 0.0–0.0)
HEMATOCRIT: 43.4 % (ref 34.1–44.9)
HEMOGLOBIN: 14.3 g/dL (ref 11.2–15.7)
MEAN CORP HGB CONC: 32.9 g/dL (ref 31.0–37.0)
MEAN CORPUSCULAR HGB: 31.8 pg (ref 26.0–34.0)
MEAN CORPUSCULAR VOL: 96.7 fl (ref 80.0–100.0)
MEAN PLATELET VOLUME: 10.7 fL (ref 8.7–12.5)
NRBC %: 0 % (ref 0.0–0.0)
PLATELET COUNT: 349 10*3/uL (ref 150–400)
RBC DISTRIBUTION WIDTH STD DEV: 43.8 fL (ref 35.1–46.3)
RED BLOOD CELL COUNT: 4.49 M/uL (ref 3.90–5.20)
WHITE BLOOD CELL COUNT: 6.5 10*3/uL (ref 4.0–11.0)

## 2020-07-03 MED ORDER — DICLOFENAC SODIUM 1 % EX GEL
2.00 g | Freq: Three times a day (TID) | CUTANEOUS | 2 refills | Status: AC | PRN
Start: 2020-07-03 — End: 2021-09-03

## 2020-07-03 MED ORDER — LEVONORGESTREL-ETHINYL ESTRAD 0.1-20 MG-MCG PO TABS
1.0000 | ORAL_TABLET | Freq: Every day | ORAL | 11 refills | Status: DC
Start: 2020-07-03 — End: 2020-08-17

## 2020-07-03 MED FILL — AVIANE: 28 days supply | Qty: 28 | Fill #0 | Status: CP

## 2020-07-03 MED FILL — DICLOFENAC SOD GL 1% 100GM TOP: 16 days supply | Qty: 100 | Fill #0 | Status: CP

## 2020-07-03 NOTE — Progress Notes (Signed)
[facility]    CC: PE    HPI: Casey [name] is a 39 year old female who presents to clinic for complete physical exam and for:    - taking OCP, makes her tired, pain in body, nervous, headaches, feels she want to fight. Lasts for 1 week before she finishes the pill.   - been taking more than 2 years, and these symptoms are now getting worse.     - also thinks she might be depressed. Feels down, no energy. If she can, she will sleep all day.   - not really sad, just worrying too much. Can't sleep due to thinking about problems and the future.   - sister is also feeling really down, and Jaley feels she can't really help her    - lots of knee pain, okay when walking, running, stairs are okay. Just can't put any weight on her kneed on the ground. Both R and L.     - meds: allergy and asthma meds as needed. Vitamin C    Health Maintenance:  Safety: family is safe, safe in relationships  Cancer screenings: up to date  Lipids/metabolic: not much exercise, not motivated to go to gym, keeps postponing  Vaccinations: needs td and pneumonia today      Review of systems:     Gen: No fevers or unexplained weight loss.   Neuro: No visual changes. No paresthesias or new headaches.   HEENT: No sore throat or ear ache.    Pulm: No cough. No dyspnea.  Cardiac: No chest pain on exertion.    GI: No abdominal pain or change in bowel habits.    Derm: No new rashes or skin changes.    Allergy: No enlarged nodes.  No new itching, sneezing or wheezing.Marland Kitchen    PMH:     Patient Active Problem List:     Developmental speech or language disorder     Open wound of wrist with complication     Mild intermittent asthma      Allergies: Review of Patient's Allergies indicates:   Cat dander              Shortness of Breath    Comment:Improved with albuterol.    Medications: [DISCONTINUED] ESTARYLLA 0.25-35 MG-MCG per tablet, TAKE 1 TABLET BY MOUTH DAILY, Disp: 84 tablet, Rfl: 3  albuterol (PROAIR HFA) 108 (90 Base) MCG/ACT inhaler, Inhale  2 puffs into the lungs every 6 (six) hours as needed for Wheezing or Shortness of breath (when exposed to cat), Disp: 1 Inhaler, Rfl: 3  [DISCONTINUED] naproxen (NAPROSYN) 500 MG tablet, Take 1 tablet by mouth 2 (two) times daily with meals, Disp: 60 tablet, Rfl: 0  fluticasone (FLOVENT HFA) 220 MCG/ACT inhaler, Inhale 2 puffs into the lungs 2 (two) times daily, Disp: 1 Inhaler, Rfl: 0  diphenhydrAMINE (BENADRYL) 25 MG capsule, Take 25 mg by mouth every 6 (six) hours as needed for Itching., Disp: , Rfl:   montelukast (SINGULAIR) 10 MG tablet, Take 1 tablet by mouth nightly., Disp: 30 tablet, Rfl: 2    No current facility-administered medications on file prior to visit.      Family/Social History:     Social History    Social History Narrative      2021: living with partner of 19 year. Son in [place], living with her mother and sister, he's in college, doing really well. 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  family history includes Cancer - Lung in her paternal uncle; Heart in her maternal grandfather and maternal uncle; In Good Health in her father; Thyroid in her mother.        Physical Exam:  BP 135/87 (Site: RA, Position: Sitting, Cuff Size: Reg)    Pulse 101    Temp 97.2 F (36.2 C) (Temporal)    Ht 4' 9.48" (1.46 m)    Wt 47.4 kg (104 lb 6.4 oz)    SpO2 100%    BMI 22.22 kg/m     Gen: well appearing female in no distress  Psych: mood is good, affect is bright with normal range, speech fluent and coherent  HEENT: PERRLA, EOMI, TMs clear, throat non-erythematous and without exudate.   NECK: no palpable lymph nodes, no thyroid enlargement or masses,   CV: RRR, no murmurs  Lung: good air movement bilaterally, no wheezes, crackles or ronchi.  Abd: soft, non-tender, non-distended   Ext: 2+ pedal pulses bilaterally, no lower extremity edema. L pre-patellar area mildly swollen, not red, only tender with firm palpation. Knee joints not swollen.   Neuro: strength and  sensation grossly intact bilaterally. Gait is narrow-based and stable.  Skin: no rashes or lesions, no skin breakdown on lower extremities.      ASSESSMENT/PLAN:  (Z00.00) Routine general medical examination at a health care facility  (primary encounter diagnosis)  Comment: Updated routine care. Discussed dentist visits, cardiovascular exercise 3-5x/week  Plan: HEPATITIS C ANTIBODY            (M25.561,  M25.562,  G89.29) Chronic pain of both knees  Comment: I suspect mild prepatellar bursitis, from a lot of time on her knees at work.   Plan: diclofenac (VOLTAREN) 1 % GEL Gel            (R53.83) Low energy  (R45.82) Feeling worried  Comment: unclear if maybe a medical illness vs primary mental health condition. Will check labs and try changing her OCP. We also talked about goal-setting/behavioral activation (ie going to gym 1 time a week, or just doing something she enjoys for 20 minutes). I also validated that her stressors are real (ie family in [place] during pandemic, paying for college, etc).    Plan: levonorgestrel-ethinyl estradiol (AVIANE)         0.1-20 MG-MCG per tablet, THYROID SCREEN TSH         REFLEX FT4, CBC WITH PLATELET, FERRITIN,         VITAMIN D,25 HYDROXY        F/u in 6 weeks, see if any change with new OCP (starting today), discuss options (apps, therapy, meds, etc)        (Z23) Need for prophylactic vaccination with tetanus-diphtheria (Td)  Comment:   Plan: IMMUNIZATION ADMIN SINGLE, TD VACCINE PRSRV         FREE 7 YRS OR OLDER FOR IM USE            (Z23) Need for prophylactic vaccination against Streptococcus pneumoniae (pneumococcus)  Comment:   Plan: PPSV23 VACCINE 2 YRS OR OLDER FOR SUBQ/IM USE                The patient was ready to learn and no apparent learning or adherence barriers were identified. I explained the diagnosis and treatment plan, and the patient expressed understanding of the content. I attempted to answer any questions regarding the diagnosis and the proposed  treatment.     We discussed the patients current medications.  We discussed  the importance of medication compliance. The patient expressed understanding and no barriers to adherence were identified.    she has been advised to call or return with any worsening or new problems        Ladell Pier, MD                         07/03/2020  VIS given prior to administration and reviewed with the patient and or legal guardian. Patient understands the disease and the vaccine. See immunization/Injection module or chart review for date of publication and additional information.  Ladell Pier, MD

## 2020-07-10 ENCOUNTER — Encounter (HOSPITAL_BASED_OUTPATIENT_CLINIC_OR_DEPARTMENT_OTHER): Payer: Self-pay | Admitting: Internal Medicine

## 2020-07-10 MED ORDER — VITAMIN D (CHOLECALCIFEROL) 25 MCG (1000 UT) PO CAPS
1.0000 | ORAL_CAPSULE | Freq: Every day | ORAL | 11 refills | Status: DC
Start: 2020-07-10 — End: 2020-08-17

## 2020-07-10 MED FILL — D3 1000UNIT: 30 days supply | Qty: 30 | Fill #0

## 2020-07-12 LAB — VITAMIN D,25 HYDROXY: VITAMIN D,25 HYDROXY: 25 ng/mL — ABNORMAL LOW (ref 30.0–100.0)

## 2020-07-12 LAB — THYROID SCREEN TSH REFLEX FT4: THYROID SCREEN TSH REFLEX FT4: 1.09 u[IU]/mL (ref 0.358–3.740)

## 2020-07-12 LAB — FERRITIN: FERRITIN: 51 ng/mL (ref 8–252)

## 2020-07-12 LAB — HEPATITIS C ANTIBODY: HEPATITIS C ANTIBODY: NONREACTIVE

## 2020-07-31 MED FILL — AVIANE: 28 days supply | Qty: 28 | Fill #1 | Status: CP

## 2020-08-17 ENCOUNTER — Ambulatory Visit: Payer: No Typology Code available for payment source | Attending: Internal Medicine | Admitting: Internal Medicine

## 2020-08-17 DIAGNOSIS — R5383 Other fatigue: Secondary | ICD-10-CM | POA: Diagnosis not present

## 2020-08-17 MED ORDER — LEVONORGESTREL-ETHINYL ESTRAD 0.1-20 MG-MCG PO TABS
1.0000 | ORAL_TABLET | Freq: Every day | ORAL | 3 refills | Status: DC
Start: 2020-08-17 — End: 2021-02-28

## 2020-08-17 MED ORDER — VITAMIN D (CHOLECALCIFEROL) 25 MCG (1000 UT) PO CAPS: 1.0000 | ORAL_CAPSULE | Freq: Every day | ORAL | 3 refills | Status: DC

## 2020-08-17 MED FILL — D3-1000 1000UNIT: 90 days supply | Qty: 90 | Fill #0

## 2020-08-17 NOTE — Progress Notes (Signed)
This visit was identified as being appropriate for telemedicine video visit during the COVID pandemic.       CC: "I'm doing a little better"      HPI:   - not 100% better, but still better  - changed to a new birth control, and that is much better  - wants a 3 months supply    - still a little tired, and little nervous, but much better (worse with period)  - sleeping well  - wondering about vitamin D - took 1 month of it  - trying to exercise a little, but not every day      Meds: reviewed in Epic    Objective:   Complete exam deferred due to televisit during the time of COVID-19 Pandemic    Appears comfortable, not in acute distress   Affect: normal with normal range  Thought process and content normal  Breathing unlabored       Previous records/results reviewed:      Assessment/Plan:  (R53.83) Low energy and anxiety  Comment: improved with time, some additional physical activity,  change in birth control, and vitamin D. No need for meds or therapy for now, follow up prn.   Plan: levonorgestrel-ethinyl estradiol (AVIANE)         0.1-20 MG-MCG per tablet        Changed to 90 day supply of meds for this and low vitamin D - advised continue script through the fall/winter at least.           I advised them that we have limited access for in-patient evaluations to minimize contacts/exposures, but we are open and are working hard to meet their needs. They should absolutely still call us for any questions or concerns they might have. Reminded about social/physical distancing.

## 2020-08-22 MED FILL — AVIANE: 84 days supply | Qty: 84 | Fill #0 | Status: CP

## 2020-11-22 MED FILL — DICLOFENAC SOD GL 1% 100GM TOP: 16 days supply | Qty: 100 | Fill #1 | Status: CP

## 2020-11-22 MED FILL — D3-1000 1000UNIT: 90 days supply | Qty: 90 | Fill #1 | Status: CP

## 2020-11-22 MED FILL — AVIANE: 84 days supply | Qty: 84 | Fill #1

## 2021-02-22 ENCOUNTER — Ambulatory Visit (HOSPITAL_BASED_OUTPATIENT_CLINIC_OR_DEPARTMENT_OTHER): Payer: Self-pay | Admitting: Ambulatory Care

## 2021-02-22 ENCOUNTER — Other Ambulatory Visit: Payer: Self-pay

## 2021-02-22 DIAGNOSIS — Z3201 Encounter for pregnancy test, result positive: Secondary | ICD-10-CM

## 2021-02-22 NOTE — Telephone Encounter (Signed)
Would advise 2 options - wait 2 days and check another home test. Or come in to lab today for blood test. I placed the order in case she wants to to do that.     Would also advise sign up for mychart so she can see the result right away (given it is Friday!)

## 2021-02-22 NOTE — Telephone Encounter (Signed)
Spoke with patient    She will come in on Monday for lab test    Appt scheduled

## 2021-02-22 NOTE — Telephone Encounter (Signed)
[place] [facility] [id number]    Patient took two at home preg tests this morning    One positive  One negative    Date of LMP-last Wednesday-very little bleeding     Patient seeking verification of test via lab work    Routed to PCP to review and advise

## 2021-02-22 NOTE — Telephone Encounter (Signed)
Returned call to pt via [place] interpreter ID # [id number] [phone]  Tried twice  No answer  Left message to call back the clinic    Cipriano Mile, RN 02/22/21 2:30 PM

## 2021-02-22 NOTE — Telephone Encounter (Signed)
Regarding: pregnancy tests   ----- Message from Otilio Saber sent at 02/22/2021 10:11 AM EST -----  Casey [name] [id number], 40 year old, female    Calls today:  Sick    What are the symptoms: did two pregnancy tests this morning, one said negative, one said positive, wants to know what she should do next.   How long has patient been sick?   What has pt. tried at home   Person calling on behalf of patient: Patient (self)    CALL BACK NUMBER: [phone]  Best time to call back:   Cell phone:   Other phone:    Patient's language of care: [place] ([place])    Patient does not need an interpreter.    Patient's PCP: Ladell Pier, MD    '

## 2021-02-25 ENCOUNTER — Other Ambulatory Visit: Payer: Self-pay

## 2021-02-25 ENCOUNTER — Ambulatory Visit: Payer: No Typology Code available for payment source | Attending: Internal Medicine

## 2021-02-25 DIAGNOSIS — Z3201 Encounter for pregnancy test, result positive: Secondary | ICD-10-CM | POA: Diagnosis not present

## 2021-02-25 LAB — HCG QUALITATIVE SERUM: HCG QUALITATIVE SERUM: NEGATIVE

## 2021-02-25 NOTE — Progress Notes (Signed)
.    labcompleted

## 2021-02-26 ENCOUNTER — Telehealth (HOSPITAL_BASED_OUTPATIENT_CLINIC_OR_DEPARTMENT_OTHER): Payer: Self-pay | Admitting: Internal Medicine

## 2021-02-26 NOTE — Telephone Encounter (Signed)
Casey Saxon [name], MD  [facility] Triage Pool  Can someone call pt re: negative serum hcg? She had equivocal home testing last week and spoke to nurses. Thanks  Spoke to pt:   Pt made aware of above results  Complaining of left lower abdomen pain x 3 weeks; dull but ongoing, thought it was start of onset menses ,  LMP 02/20/21  Scheduled in person visit 3/17 with Lawson Fiscal  Advised if pain worsens prior to being seen, to go to ED for further evaluation  Pt agrees to plan of care

## 2021-02-26 NOTE — Telephone Encounter (Signed)
Casey [name] is a 40 year old old female.    Patient's PCP: Ladell Pier, MD    In case we get disconnected what is the best number to reach you at today:  Home Phone [phone] (home)    Person calling:  Patient (self)    How can I help you today:   Other patient calling requesting lab test results.     Patient's language of care: [place] ([place])    Would you like an interpreter when the nurse calls you back?  NO

## 2021-02-26 NOTE — Telephone Encounter (Signed)
-----   Message from [place] E. [name], MD sent at 02/26/2021 11:57 AM EDT -----  Can someone call pt re: negative serum hcg? She had equivocal home testing last week and spoke to nurses. Thanks!

## 2021-02-28 ENCOUNTER — Other Ambulatory Visit: Payer: Self-pay

## 2021-02-28 ENCOUNTER — Encounter (HOSPITAL_BASED_OUTPATIENT_CLINIC_OR_DEPARTMENT_OTHER): Payer: Self-pay | Admitting: Family Medicine

## 2021-02-28 ENCOUNTER — Ambulatory Visit: Payer: No Typology Code available for payment source | Attending: Family Medicine | Admitting: Family Medicine

## 2021-02-28 VITALS — BP 137/85 | HR 114 | Temp 97.0°F | Ht <= 58 in | Wt 102.0 lb

## 2021-02-28 DIAGNOSIS — R102 Pelvic and perineal pain unspecified side: Secondary | ICD-10-CM

## 2021-02-28 DIAGNOSIS — N6310 Unspecified lump in the right breast, unspecified quadrant: Secondary | ICD-10-CM | POA: Insufficient documentation

## 2021-02-28 DIAGNOSIS — Z3041 Encounter for surveillance of contraceptive pills: Secondary | ICD-10-CM | POA: Diagnosis present

## 2021-02-28 LAB — URINE PREGNANCY TEST (POINT OF CARE): HCG QUALITATIVE URINE: NEGATIVE

## 2021-02-28 LAB — URINALYSIS RFLX TO URINE CULT
BILIRUBIN, URINE: NEGATIVE
CASTS: NONE SEEN PER LPF
CRYSTALS: NONE SEEN
GLUCOSE, URINE: NEGATIVE MG/DL
LEUKOCYTE ESTERASE: NEGATIVE
NITRITE, URINE: NEGATIVE
PH URINE: 7 (ref 5.0–8.0)
PROTEIN, URINE: NEGATIVE MG/DL
SPECIFIC GRAVITY URINE: 1.015 (ref 1.003–1.035)
SQUAMOUS EPITHELIAL CELLS: >10 PER LPF — AB (ref 0–4)

## 2021-02-28 LAB — VAGINITIS PANEL PCR
BACTERIAL VAGINOSIS: NEGATIVE
CANDIDA GLABRATA: NEGATIVE
CANDIDA KRUSEI: NEGATIVE
CANDIDA SPECIES: NEGATIVE
TRICH VAGINALIS: NEGATIVE

## 2021-02-28 LAB — POC URINALYSIS
BILIRUBIN, URINE: NEGATIVE
GLUCOSE,URINE: NEGATIVE
LEUKOCYTE ESTERASE: NEGATIVE
NITRITE, URINE: NEGATIVE
PH URINE: 7 (ref 5.0–8.0)
PROTEIN, URINE: NEGATIVE
SPECIFIC GRAVITY, URINE: 1.02 (ref 1.003–1.030)
UROBILINOGEN URINE: 0.2 (ref 0.2–1.0)

## 2021-02-28 LAB — URINE CULTURE WILL NOT BE DONE

## 2021-02-28 NOTE — Progress Notes (Addendum)
SUBJECTIVE:   40 year old female primary language [place], able to speak [place], presents here today C/O lower abdominal pain with a stabbing sensation on the left side for 3 weeks. Pain is constant. She does have pain with IC  She reports her LMP on 02/20/2021 with darker color than usual.  On Thursday, 02/21/2021 she did a home pregnancy test that was positive.  On Monday 02/25/2021 Blood test was negative for pregnancy.  She says she had a hx of ovarian cyst many years ago  Has been on OCP's for 19 years.     She denies fever, chills, N/V, constipation/diarrhea; vaginal discharge, urinary sx's/dysuria    She reports smoking 3-4 cigarettes daily but will stop if she is pregnant today  She stopped her birth control pill 2 months ago. She wanted a break from them. Is not going to resume then. Is not using anything to prevent pregnancy. Not  Interested in other methods.    Current Outpatient Medications   Medication Sig    cholecalciferol 25 MCG (1000 UT) CAPS Take 1 capsule by mouth daily    levonorgestrel-ethinyl estradiol (AVIANE) 0.1-20 MG-MCG per tablet Take 1 tablet by mouth daily    diclofenac (VOLTAREN) 1 % GEL Gel Apply 2 g topically 3 (three) times daily as needed    albuterol (PROAIR HFA) 108 (90 Base) MCG/ACT inhaler Inhale 2 puffs into the lungs every 6 (six) hours as needed for Wheezing or Shortness of breath (when exposed to cat)    fluticasone (FLOVENT HFA) 220 MCG/ACT inhaler Inhale 2 puffs into the lungs 2 (two) times daily    diphenhydrAMINE (BENADRYL) 25 MG capsule Take 25 mg by mouth every 6 (six) hours as needed for Itching.    montelukast (SINGULAIR) 10 MG tablet Take 1 tablet by mouth nightly.     No current facility-administered medications for this visit.     Allergies: Cat Dander   Patient's last menstrual period was 02/14/2021.    ROS:  Feeling well. No dyspnea or chest pain on exertion.   Lower abdominal pain, every other day bowel movement with mild constipation. No   black or bloody stools. Denies burning sensation with urination. Regular menses on OCP's., denies vaginal discharge    OBJECTIVE:   The patient appears well, in NAD.   BP 137/85 (Site: LA, Position: Sitting, Cuff Size: Reg)    Pulse 114    Temp 97 F (36.1 C) (Temporal)    Ht 4' 9.87" (1.47 m)    Wt 46.3 kg (102 lb)    LMP 02/14/2021    SpO2 100%    BMI 21.41 kg/m     Constitutional: Well developed, Well nourished, No acute distress,   CV:NL S1/S2,  No murmurs, rubs or gallops.   Resp: CTAB, No respiratory distress, no crackles or rhonchi   Abdomen: Soft, mild tenderness on left lower quadrant, non-distended, Normal bowel sounds.   Extremities: Intact distal pulses, WWP, No edema.   Neurologic: Alert and oriented x4, moves all extremities with full power and strength, sensation intact bilateral. Visual field normal. Normal gait without ataxia.   Skin: no suspicious rash or lesion noted   Breast: Bilateral upper outer quadrants with generalized nodularity.  Right breast upper outer quadrant  at 9 0 clock position with more prominent nodularity as compared to the left in the same location.   Soft, Non tender with palpation. No nipple dicharge  Musculoskeletal: intact strength in all ext, ROM intact in all extremities  Lymph: no head/neck  supraventricular nodes    PELVIC EXAM:  Normal vagina wall and vulva, normal cervix without lesions, polyps or tenderness. Copious amount of white vaginal discharge.  Uterus: Non tender, not enlarged  Adnexa: Slight tenderness on the left with bimanual; not enlarged-no masses palpated. Negative on the right      POC UPT=negative    POC Udip= negative except for trace ketone and trace occult blood    Plan/ASSESSMENT:   (R10.2) Pelvic pain  Comment: x 3 weeks; Stabbing pain on the left lower quadrant, large amount of vaginal discharge on exam.  Plan: CHLAMYDIA GC NAAT, VAGINITIS PANEL PCR, [place]         PELVIS NON-PREGNANT, URINALYSIS RFLX TO URINE CULT; POC UPT   Ed: possible  etiology, rationale for tests; given telephone number to call and schedule [place]; s/s worsening condition  F/U:  Based on results and prn if sx's persist or worsen        (N63.10) Breast mass, right  Comment: Found incidentally on exam-more prominent nodularity vs left. Will obtain imaging.   Plan: [place] US BREAST-AXILLA RIGHT, [facility] DIAGNOSTIC MAMMO         UNI RIGHT DIGITAL WITH DBT & CAD  Ed: Discussed need for imaging; BSE-given telephone number to call and schedule the radiology appointment.  F/U: Based on results and as needed.      (Z30.41) Encounter for surveillance of contraceptive pills  Comment: She is on combined COCP's and is a smoker and over 35  Plan: Discussed that she should not be taking combined OCPs as she is over 35 and a smoker.  Discussed this greatly increases her risks of clots/stroke.  She is not planning on resuming them as she wants a break from them.  I explained that if she decides she would like to resume she should have a progestin only method ie pill,implant etc.  Advised condoms in the meantime.  She says she is not planning on getting pregnant but said that she was happy when she thought she was pregnant last week.        Mat Carne, RN, 02/28/2021

## 2021-03-01 ENCOUNTER — Telehealth (HOSPITAL_BASED_OUTPATIENT_CLINIC_OR_DEPARTMENT_OTHER): Payer: Self-pay | Admitting: Licensed Practical Nurse

## 2021-03-01 ENCOUNTER — Other Ambulatory Visit (HOSPITAL_BASED_OUTPATIENT_CLINIC_OR_DEPARTMENT_OTHER): Payer: Self-pay | Admitting: Family Medicine

## 2021-03-01 DIAGNOSIS — R3129 Other microscopic hematuria: Secondary | ICD-10-CM

## 2021-03-01 LAB — CHLAMYDIA GC NAAT
CHLAMYDIA TRACHOMATIS NAAT: NEGATIVE
NEISSERIA GONORRHOEAE NAAT: NEGATIVE

## 2021-03-01 NOTE — Telephone Encounter (Signed)
Lawson Fiscal, APRN  [facility] Triage Pool  Dear RN,     Please:    1. Create Telephone encounter for this patient.   2. Share with the patient : please call and let her know that her UA showed some red blood cells though no white cells that would indicate infection. It also showed that it may not have been a good clean catch. I would like her to repeat the clean catch UA to check for this again. There is something called microscopic hematuria where there is blood in the urine that you can't see with the eye and if this persists it needs to be further worked up. Please review clean catch urine with her. Please help her book a lab only appt for this. Orders placed. Please let her know the GC/chlamydia test was negative and that the vaginitis panel was also negative for BV/yeast     Plan:   1. As above     2. Type of Outreach: 3 phone calls and if unable to reach send letter     3. Document the conversation in the Telephone Encounter and close the encounter, no need to send back to me.     Thank you,   Lawson Fiscal, APRN     Spoke to pt  Made aware of above message/NP orders/advicement  Pt agrees to plan of care  Scheduled lab visit 3/22 @ 9 am- double booked

## 2021-03-01 NOTE — Progress Notes (Signed)
Dear RN,    Please:    1. Create Telephone encounter for this patient.  2. Share with the patient : please call and let her know that her UA showed some red blood cells though no white cells that would indicate infection. It also showed that it may not have been a good clean catch. I would like her to repeat the clean catch UA to check for this again. There is something called microscopic hematuria where there is blood in the urine that you can't see with the eye and if this persists it needs to be further worked up. Please review clean catch urine with her. Please help her book a lab only appt for this. Orders placed. Please let her know the GC/chlamydia test was negative and that the vaginitis panel was also negative for BV/yeast    Plan:  1. As above    2. Type of Outreach: 3 phone calls and if unable to reach send letter    3. Document the conversation in the Telephone Encounter and close the encounter, no need to send back to me.     Thank you,  Lawson Fiscal, APRN

## 2021-03-01 NOTE — Telephone Encounter (Signed)
-----   Message from Lawson Fiscal, APRN sent at 03/01/2021  3:07 PM EDT -----  Dear RN,    Please:    1. Create Telephone encounter for this patient.  2. Share with the patient : please call and let her know that her UA showed some red blood cells though no white cells that would indicate infection. It also showed that it may not have been a good clean catch. I would like her to repeat the clean catch UA to check for this again. There is something called microscopic hematuria where there is blood in the urine that you can't see with the eye and if this persists it needs to be further worked up. Please review clean catch urine with her. Please help her book a lab only appt for this. Orders placed. Please let her know the GC/chlamydia test was negative and that the vaginitis panel was also negative for BV/yeast    Plan:  1. As above    2. Type of Outreach: 3 phone calls and if unable to reach send letter    3. Document the conversation in the Telephone Encounter and close the encounter, no need to send back to me.     Thank you,  Lawson Fiscal, APRN

## 2021-03-04 ENCOUNTER — Other Ambulatory Visit: Payer: Self-pay

## 2021-03-04 ENCOUNTER — Encounter (HOSPITAL_BASED_OUTPATIENT_CLINIC_OR_DEPARTMENT_OTHER): Payer: Self-pay | Admitting: Nurse Practitioner

## 2021-03-04 ENCOUNTER — Ambulatory Visit: Payer: No Typology Code available for payment source | Attending: Family Medicine

## 2021-03-04 DIAGNOSIS — R3129 Other microscopic hematuria: Secondary | ICD-10-CM | POA: Diagnosis not present

## 2021-03-04 LAB — URINALYSIS RFLX TO URINE CULT
BILIRUBIN, URINE: NEGATIVE
GLUCOSE, URINE: NEGATIVE MG/DL
KETONE, URINE: NEGATIVE MG/DL
LEUKOCYTE ESTERASE: NEGATIVE
NITRITE, URINE: NEGATIVE
OCCULT BLOOD, URINE: NEGATIVE
PH URINE: 6.5 (ref 5.0–8.0)
PROTEIN, URINE: NEGATIVE MG/DL
SPECIFIC GRAVITY URINE: 1.02 (ref 1.003–1.035)

## 2021-03-04 NOTE — Progress Notes (Signed)
Lab completed.

## 2021-03-05 MED ORDER — ALBUTEROL SULFATE HFA 108 (90 BASE) MCG/ACT IN AERS
2.0000 | INHALATION_SPRAY | Freq: Four times a day (QID) | RESPIRATORY_TRACT | 5 refills | Status: DC | PRN
Start: 2021-03-05 — End: 2022-05-16

## 2021-03-05 MED FILL — PROAIR HFA  AER: 25 days supply | Qty: 9 | Fill #0

## 2021-03-05 NOTE — Telephone Encounter (Signed)
PER Patient (self), Casey [name] is a 40 year old female has requested a refill of albuterol inh hfa 108 mcg.      Last Office Visit: [id number] with Casilda Carls  Last Physical Exam: [id number]      Other Med Adult:  Most Recent BP Reading(s)  02/28/21 : 137/85        Cholesterol (mg/dL)   Date Value   32/01/3342 145     LOW DENSITY LIPOPROTEIN DIRECT (mg/dL)   Date Value   56/86/1683 72     HIGH DENSITY LIPOPROTEIN (mg/dL)   Date Value   72/90/2111 71     TRIGLYCERIDES (mg/dL)   Date Value   55/20/8022 86         THYROID SCREEN TSH REFLEX FT4 (uIU/mL)   Date Value   07/03/2020 1.090         No results found for: TSH    No results found for: HGBA1C    No results found for: POCA1C      INR (no units)   Date Value   04/02/2010 0.9 (L)       SODIUM (mmol/L)   Date Value   03/19/2012 131 (L)       POTASSIUM (mmol/L)   Date Value   03/19/2012 3.9           CREATININE (mg/dl)   Date Value   33/61/2244 1.0       Documented patient preferred pharmacies:    [facility], [place], [place] - [address]  Phone: [phone] Fax: [phone]

## 2021-03-06 ENCOUNTER — Encounter (HOSPITAL_BASED_OUTPATIENT_CLINIC_OR_DEPARTMENT_OTHER): Payer: Self-pay | Admitting: Family Medicine

## 2021-03-25 ENCOUNTER — Ambulatory Visit
Admission: RE | Admit: 2021-03-25 | Discharge: 2021-03-25 | Disposition: A | Discharge status: Home / Self Care | Payer: No Typology Code available for payment source | Attending: Family Medicine | Admitting: Family Medicine

## 2021-03-25 ENCOUNTER — Other Ambulatory Visit (HOSPITAL_BASED_OUTPATIENT_CLINIC_OR_DEPARTMENT_OTHER): Payer: Self-pay | Admitting: Family Medicine

## 2021-03-25 ENCOUNTER — Encounter (HOSPITAL_BASED_OUTPATIENT_CLINIC_OR_DEPARTMENT_OTHER): Payer: Self-pay

## 2021-03-25 ENCOUNTER — Other Ambulatory Visit: Payer: Self-pay

## 2021-03-25 ENCOUNTER — Ambulatory Visit (HOSPITAL_BASED_OUTPATIENT_CLINIC_OR_DEPARTMENT_OTHER)
Admission: RE | Admit: 2021-03-25 | Discharge: 2021-03-25 | Disposition: A | Discharge status: Home / Self Care | Payer: No Typology Code available for payment source | Source: Ambulatory Visit

## 2021-03-25 DIAGNOSIS — R102 Pelvic and perineal pain unspecified side: Secondary | ICD-10-CM

## 2021-03-25 DIAGNOSIS — N83201 Unspecified ovarian cyst, right side: Secondary | ICD-10-CM | POA: Insufficient documentation

## 2021-03-25 DIAGNOSIS — N6310 Unspecified lump in the right breast, unspecified quadrant: Secondary | ICD-10-CM

## 2021-03-25 DIAGNOSIS — N6459 Other signs and symptoms in breast: Secondary | ICD-10-CM | POA: Diagnosis not present

## 2021-03-27 NOTE — Progress Notes (Signed)
Dear RN,    Please:    1. Create Telephone encounter for this patient.  2. Share with the patient: Please call and let her know that her right breast US was normal and she should just do a monthly breast self exam to get familiar with her breasts and let us know if there are any changes.  Her pelvic US was normal.  The left ovary was normal. The right  ovary had  a small cyst and appears to be the type that occurs around ovulation. These cysts usually resolve without  treatment. If she is still having the pain I could refer her to GYN to see what they thought. Let me know and I will place the referral. She can call [phone] for [facility] or [phone] for [facility].     Plan:  1. As above    2. Type of Outreach: 3 phone calls and if unable to reach send letter    3. Document the conversation in the Telephone Encounter and send the encounter back to me to complete orders and any further necessary documentation.     Thank you,  Lawson Fiscal, APRN

## 2021-03-28 ENCOUNTER — Telehealth (HOSPITAL_BASED_OUTPATIENT_CLINIC_OR_DEPARTMENT_OTHER): Payer: Self-pay | Admitting: Licensed Practical Nurse

## 2021-03-28 NOTE — Telephone Encounter (Signed)
-----   Message from Lawson Fiscal, APRN sent at 03/27/2021  1:10 PM EDT -----  Dear RN,    Please:    1. Create Telephone encounter for this patient.  2. Share with the patient: Please call and let her know that her right breast US was normal and she should just do a monthly breast self exam to get familiar with her breasts and let us know if there are any changes.  Her pelvic US was normal.  The left ovary was normal. The right  ovary had  a small cyst and appears to be the type that occurs around ovulation. These cysts usually resolve without  treatment. If she is still having the pain I could refer her to GYN to see what they thought. Let me know and I will place the referral. She can call [phone] for [facility] or [phone] for [facility].     Plan:  1. As above    2. Type of Outreach: 3 phone calls and if unable to reach send letter    3. Document the conversation in the Telephone Encounter and send the encounter back to me to complete orders and any further necessary documentation.     Thank you,  Lawson Fiscal, APRN

## 2021-03-28 NOTE — Telephone Encounter (Signed)
-----   Message from Christine Hiatt, APRN sent at 03/27/2021  1:10 PM EDT -----  Dear RN,    Please:    1. Create Telephone encounter for this patient.  2. Share with the patient: Please call and let her know that her right breast US was normal and she should just do a monthly breast self exam to get familiar with her breasts and let us know if there are any changes.  Her pelvic US was normal.  The left ovary was normal. The right  ovary had  a small cyst and appears to be the type that occurs around ovulation. These cysts usually resolve without  treatment. If she is still having the pain I could refer her to GYN to see what they thought. Let me know and I will place the referral. She can call [phone] for [facility] or [phone] for [facility].     Plan:  1. As above    2. Type of Outreach: 3 phone calls and if unable to reach send letter    3. Document the conversation in the Telephone Encounter and send the encounter back to me to complete orders and any further necessary documentation.     Thank you,  Christine Hiatt, APRN

## 2021-03-28 NOTE — Telephone Encounter (Signed)
Re: results  Spoke to pt  Made aware of NP message/advisement  Agrees to plan of care  Will call clinic/ (MY chart) if pain re occurs

## 2021-05-24 MED FILL — VITAMIN D3-1000 1000UNIT: 90 days supply | Qty: 90 | Fill #2

## 2021-05-24 MED FILL — PROAIR HFA  AER: 25 days supply | Qty: 9 | Fill #1

## 2021-05-24 MED FILL — AVIANE: 84 days supply | Qty: 84 | Fill #2

## 2021-06-25 ENCOUNTER — Ambulatory Visit (HOSPITAL_BASED_OUTPATIENT_CLINIC_OR_DEPARTMENT_OTHER): Payer: Self-pay

## 2021-06-25 ENCOUNTER — Other Ambulatory Visit: Payer: Self-pay

## 2021-06-25 ENCOUNTER — Ambulatory Visit: Payer: No Typology Code available for payment source | Attending: Family Medicine | Admitting: Family Medicine

## 2021-06-25 VITALS — BP 144/90 | HR 109 | Temp 96.6°F | Wt 103.0 lb

## 2021-06-25 DIAGNOSIS — F4323 Adjustment disorder with mixed anxiety and depressed mood: Secondary | ICD-10-CM | POA: Insufficient documentation

## 2021-06-25 DIAGNOSIS — Z3009 Encounter for other general counseling and advice on contraception: Secondary | ICD-10-CM | POA: Diagnosis present

## 2021-06-25 LAB — THYROID SCREEN TSH REFLEX FT4: THYROID SCREEN TSH REFLEX FT4: 1.19 u[IU]/mL (ref 0.270–4.200)

## 2021-06-25 MED ORDER — SERTRALINE HCL 50 MG PO TABS
50.0000 mg | ORAL_TABLET | Freq: Every day | ORAL | 2 refills | Status: DC
Start: 2021-06-25 — End: 2021-08-01

## 2021-06-25 MED FILL — SERTRALINE 50MG: 30 days supply | Qty: 30 | Fill #0

## 2021-06-25 NOTE — Progress Notes (Signed)
Sexual and Reproductive Health Counselor Visit - CONTRACEPTION COUNSELING      An interpreter was not required during this encounter as the [facility] Health Counselor has been certified by [employer] as bilingual in [place] and the patient's language ([place]      Reason for Visit: [facility] visit reason: New contraceptive method    How did you hear about [place]? Established pt    Coverage for [place] Visit needed: No  Confidential communication needed for follow up: No   Safe or Confidential Phone #:   Preferred Name: Casey [name]  Pronouns: she/hers    Medications and allergies reviewed and updated.  Discrepancies vs med list in Epic: no      Vitals:  Extended Vitals not filed for this encounter.    Social History:  Home Life: by herself  Work: yes  School: no     GYN History:  LMP: 06/14/21  OB History   G1  P1  T0  P0  A0  L0    SAB0  IAB0  Ectopic0  Molar0  Multiple0  Live Births0   Pregnancy Desired: No   If yes, when:  Current GYN Symptoms (vaginal discharge, odor, itching; genital lesion; irregular periods, painful periods): no    Sexual History:    Sexual activity: Yes   Partners: Male   Birth control/ protection: I.U.D.     Do you have sex? yes  Current Partner(s): no, just sepateded  Current Sexual Activity: Vaginal  Sexual Concerns or Questions: no    Date of last sexual activity: 06/16/21  Protection Used: No  Barrier use (external or internal condoms, dental dams): 50 percent of time  Emergency Contraception (EC) Need: no    Sexual assault / IPV Assessment:   Do you feel safe at home? Yes   Do you feel safe with your partner or parent/guardian? Yes   Has anyone ever touched you against your will or without consent? No   Have you ever been forced or pressured to engage in sexual activities you did not want to? No   Have you ever had unwanted sex while under the influence of alcohol or drugs? No   Do you feel you have control over your sexual relationships and will be listened to if you say no? Yes   Is your visit  today because of a sexual experience you did not want to happen? No   Has anyone ever given you money or favors in exchange for sex? No    STI history and exposure  Ever tested for STIs before?:  yes  History of positive tests? (If yes, what and when): no  Last STI testing date: 02/28/21  New partners since last testing? No  Ever treated for an STI?:  No  Current partner ever been treated? no  Last sexual contact without a barrier (include oral / vaginal / anal / other): 06/16/21  Testing offered today: Yes  Testing accepted: No     Contraception History:  Current method of contraception: none  Satisfaction with Current Method(s): N/A   Side Effects/Problems with current method: n/a  Using Method Correctly: N/A  Contraceptive Use History: OCPs  Depo Provera  Norplant  IUD   Reason(s) for discontinuation of prior method(s): n/a    Contraindications screening:  Has the patient:  - Given birth in the past 6 weeks - No   If yes, delivery date:    If yes, currently breastfeeding?   - Ever had surgery or have any upcoming  surgeries- No   If yes, for what:   - Does the patient currently smoke: Yes: 15 or more cigarettes per day/0.5-1 e-cigarette cartridge per day.  - Does the patient report having or having been told that they have:   - Breast problems, breast lumps, or breast cancer - No  - Bleeding between periods or unexplained vaginal bleeding - No  - High blood pressure - No  - Migraine headaches - No           - History of migraine with aura: No  - History of heart attack or cardiomyopathy - No  - Blood clot or stroke - No  - Problems with liver (including hepatitis) - No  - Cancer - No  - Lupus - No  - Diabetes with neuropathy or severe kidney or eye problems - No  - Is the patient taking any of the following medications: seizure medications (phenytoin, carbamazepine, barbiturates, primidone, topiramate, oxcarbazepine), rifampin for tuberculosis treatment, or antiretrovirals (HAART or PrEP)?  no    Education/Counseling:  Counseled on the importance of health maintenance and prevention.  Anatomy/Physiology  Healthcare maintenance  Contraceptive overview: Risks, Benefits, Warning Signs, Effectiveness, Side Effects, Instructions, Follow Up  STD/HIV Risk Assessment Done  STD/HIV prevention/safer sex discussed  Condoms offered and  Declined  Emergency Contraception Discussed  Communication with parent or other supportive adult encouraged  Confidentiality      Plan:    Patient currently using no  contraception.    After a discussion on all available contraceptive methods and a screening of contraindications per the U.S. Medical Eligibility Criteria, patient has chosen Hormonal implant (Nexplanon).    The [facility] Counselor reviewed the Nexplanon implant consent with the patient. Counseling was provided on risks and benefits of the procedure and chosen LARC. The patient is aware that they will sign the consent with their provider at the time of insertion/removal.    Patient was counseled on risks, benefits, appropriate method use per the U.S. Selected Practice Recommendations, method effectiveness, follow up plan, protection from STIs, warning signs and what to do if they experience a warning sign, and confirms understanding by the teach-back method. Patient has received the method fact sheet and will contact the health center with any questions or concerns.     Staff message sent to [facility] front desk pool to schedule LARC insertion.     Emergency contraception (EC) offered? No    STI testing arranged: No    Follow Up Plan:     Casey [name] is a 40 year old female  Contacted today to discuss contraceptive method.  Pt stated using IUD before and had irregular bleeding and cramping, Pt reported using Depo injection and had weight gain as side effects.  Discussed with pt regarding contraceptive method based on possible risks, benefits, effectiveness, warning signs and side effects and pt chose the Nexplanon  implant.  Pt verbalized understanding instructions and plan care.    Alexandria Lodge      [facility] counselor reviewed the following consent for services information, employing the teach-back method to ensure comprehension. A copy of the [facility] consent was sent to the patient via [facility] or email].    1. The patient voluntarily requests sexual and reproductive health services. No one is forcing them to do this.  2. The patient was told about the physical exams and lab tests they may need related to these services.  3. The patient understands that all of their personal health information will  be kept confidential and will be used only by the health care providers involved in their care, unless they give their permission in writing or it is required or allowed by law. For example, some infectious diseases must be reported to the state department of public health.  4. The patient understands that they have a right to a copy of all their medical records.  5. The patient understands that it is their responsibility to provide correct personal health information so that the health care providers can provide the care that is right for them.  6. The patient understands that they can get sexual and reproductive health services even if they cannot pay for these services.  7. The patient understands that they do not have to accept sexual and reproductive health services in order to get any other service at this health center.  8. The patient received instructions about how to contact the health center if they have questions or problems, or in an emergency.  9. The patient understands that the sexual and reproductive health services they are receiving may be funded in part by the [employer] ([facility]). They give [facility] permission to review their sexual and reproductive health record if needed to be sure the services are high quality. They understand that their information will only be used in  summary or statistical form, without using their name or any other personal identification.  10. The patient gave a phone number (confidential if necessary) where they can be reached in case of an emergency.  11. The patient discussed whether they want to involve a family member in their decision to receive sexual and reproductive health services. They understand that they may ask for and receive sexual and reproductive health services without the permission or notification of a parent, other family member, or guardian.  12. The patient understands that information about their sexual and reproductive health visit may be shared without their permission if they are or may be abused or neglected, if they might harm themself or someone else, or if their life is in danger.

## 2021-06-25 NOTE — Patient Instructions (Signed)
Schedule an appt with the reproductive health counselor to discuss contraceptive options-nexplanon in particular      Schedule in person with Lawson Fiscal in 4 eeks for follow up on anxiety and depression

## 2021-06-25 NOTE — Progress Notes (Addendum)
Due to the language barrier, the office visit was conducted in [place] with an interpreter. The interpreter's name is Casey [name] [phone] . The interpreter was on the phone during the entire visit.      Casey [name]  Is a 40 year old female presents today for    Anxiety/depression  Ended relationship of 22 years  with long time partner-never married- about 4 months ago. Partner moved out of home.  States the relationship has been difficult for the past 2 years. States she stayed and was hoping for things to get better. Never any DV but infidelity.  Has been feeling depressed for the past 2 years and was able to manage.  Anxiety/depression sx's have worsened the past 4 months and feels now she needs help.  Feeling down, low energy, no appetite, feels sad, crying a lot, not sleeping, losing weight. Worrying constantly-especially at night.  Sometimes feels not wanting to live but states " I never want to kill myself or someone else" says she has family and friends  Denies SI/HI  She is interested in therapy and medication.      She is interested in Nexplanon implant.  Was told she could not be on OCPs if she was smoking.  Stopped OCPs in January. No menses on OCP's.  Smokes 1 pack a week.  LMP=06/14/21        VS  BP 144/90    Pulse 109    Temp 96.6 F (35.9 C) (Temporal)    Wt 46.7 kg (103 lb)    LMP 06/14/2021    SpO2 100%    BMI 21.62 kg/m   Most Recent Weight Reading(s)  06/25/21 : 46.7 kg (103 lb)  02/28/21 : 46.3 kg (102 lb)  07/03/20 : 47.4 kg (104 lb 6.4 oz)  12/16/17 : 47.6 kg (105 lb)  08/28/17 : 47.2 kg (104 lb)    Repeat BP 128/86, HR 84      PHYSICAL EXAM  General: well appearing, in no distress  Vitals: vital signs reviewed-see nurses notes  HEENT: Eyes: sclera clear,  Neck: supple, no LAD; thyroid not enlarged.  Lungs: clear to auscultation, without rales, rhonci or wheezing  Cardiovascular: Regular rhythm, normal sounds and absence of murmurs, rubs or gallops  Abdomen: soft, unremarkable and without  evidence of organomegaly or masses. No guarding.  EXT: non edematous. Normal gait.      PHQ9 =25 not difficult at all  GAD7=20 not difficult at all      A/P  (F43.23) Adjustment reaction with anxiety and depression  Comment: Ended relationship of 22 years with long time partner 4 months ago. Feeling down, low energy, no appetite, feels sad, crying a lot, not sleeping, losing weight. Worrying constantly especially at night. Denies SI/HI  Plan: sertraline (ZOLOFT) 50 MG tablet, THYROID         SCREEN TSH REFLEX FT4   RFL TO ADULT OUTPT PSYCH        (NEW) BH PTS  Discussed medication use, dose and side effects. Discussed to start with half a tablet ( 25 mg) once a day for 1 week and then after one week go up to one full tab ( 50 mg) once a day and stay on this. Discussed may need up to 6 weeks to see full effect of med. Explained BH will contact her  Discussed to go to nearest ED or call 911 if feeling unsafe or has any SI/HI  F/U: RTC in  4 weeks for re evaluation;  can call sooner if any concerns

## 2021-06-26 ENCOUNTER — Encounter (HOSPITAL_BASED_OUTPATIENT_CLINIC_OR_DEPARTMENT_OTHER): Payer: Self-pay | Admitting: Family Medicine

## 2021-07-01 ENCOUNTER — Telehealth (HOSPITAL_BASED_OUTPATIENT_CLINIC_OR_DEPARTMENT_OTHER): Payer: Self-pay | Admitting: Internal Medicine

## 2021-07-01 NOTE — Telephone Encounter (Signed)
Planned Care Outreach  [facility] to[facility] appointment[facility]. No interpreter needed.   Patient (self) did not answer at [phone] ; this team member left a message asking for a call back.  I have completed the following communication and reminder steps: Patient notified by telephone  Victorino December, 07/01/2021    On behalf of [facility]:[id number] Pieter Partridge, MD      Nexplanon insertion can be done by the follow Providers    Dr. Sherlean Foot minutes   Dr. Sheppard Evens minutes   Dr. Burnett Harry. --40 minutes

## 2021-07-01 NOTE — Telephone Encounter (Signed)
-----   Message from Alexandria Lodge sent at 06/25/2021  3:25 PM EDT -----  Hello,    Please reach out to pt to schedule Nexplanon implant insertion visit.    Thank you!    Alexandria Lodge

## 2021-08-01 ENCOUNTER — Other Ambulatory Visit: Payer: Self-pay

## 2021-08-01 ENCOUNTER — Ambulatory Visit: Payer: No Typology Code available for payment source | Attending: Family Medicine | Admitting: Family Medicine

## 2021-08-01 ENCOUNTER — Encounter (HOSPITAL_BASED_OUTPATIENT_CLINIC_OR_DEPARTMENT_OTHER): Payer: Self-pay | Admitting: Family Medicine

## 2021-08-01 VITALS — BP 123/82 | HR 107 | Temp 97.0°F | Ht <= 58 in | Wt 103.0 lb

## 2021-08-01 DIAGNOSIS — F4323 Adjustment disorder with mixed anxiety and depressed mood: Secondary | ICD-10-CM | POA: Diagnosis present

## 2021-08-01 DIAGNOSIS — J029 Acute pharyngitis, unspecified: Secondary | ICD-10-CM | POA: Insufficient documentation

## 2021-08-01 LAB — RAPID STREP (POINT OF CARE): STREP SCREEN: NEGATIVE

## 2021-08-01 MED ORDER — FLUTICASONE PROPIONATE 50 MCG/ACT NA SUSP
1.00 | Freq: Two times a day (BID) | NASAL | 2 refills | Status: AC | PRN
Start: 2021-08-01 — End: 2021-10-30

## 2021-08-01 MED FILL — FLUTICASONE SPR 50MCG: 30 days supply | Qty: 16 | Fill #0

## 2021-08-01 NOTE — Patient Instructions (Signed)
Please schedule a 40 minute appt with a provider here at [facility] that does the Nexplanon implant for Nexplanon insertion. She wants this done

## 2021-08-01 NOTE — Progress Notes (Signed)
S: Casey [name] is a 40 year old female    She is here to follow up on anxiety and depression however is having an issue with a sore throat as well.      She stopped the Sertraline after a week as she felt it made her move slowly and feel tired. She has an initial therapy appt coming up 09/18/21. She says her mood is better. Doesn't think she needs medication at this point      Past 6 days with a sore throat and bilateral ear pain. Hurts to swallow. Slight itchy eyes. Feels some nasal congestion.  Slight cough in the AM upon waking like needs to cough something up. No one else at home ill  Took a home covid test today that was negative.  Denies fever, chills, ear pain, SOB, wheeze, chest pain.    BP 123/82 (Site: LA, Position: Sitting, Cuff Size: Reg)    Pulse 107    Temp 97 F (36.1 C) (Temporal)    Ht 4\' 9"  (1.448 m)    Wt 46.7 kg (103 lb)    LMP 07/15/2021 (Exact Date)    SpO2 99%    BMI 22.29 kg/m      O:  PHYSICAL EXAM  General: well appearing, in no distress  Vitals: vital signs reviewed-see nurses notes  HEENT: Eyes: sclera clear, EOMI, PERRLA  Ears:Bilateral canals negative; Bilateral ears a little dull and slightly retracted; some scarring, no erythema  Nares: patent-very congested  Oral: No erythema, no tonsils noted  Neck: supple, no LAD; thyroid not enlarged.  Lungs: clear to auscultation, without rales, rhonci or wheezing  Cardiovascular: Regular rhythm, normal sounds and absence of murmurs, rubs or gallops    Rapid strep negative    PHQ9=10 not difficult to somewhat difficult  GAD 7=6 not difficult at all      A/P: (F43.23) Adjustment reaction with anxiety and depression  (primary encounter diagnosis)  Comment: Stopped the Sertraline after a week as it made her tired; mood has improved does not feel that she needs any medication.  Plan: Discussed that if she changes her mind and would like to try the medication again she should take it at bedtime if it made her sleepy.  Advised if she  does restart the medication she should contact me to schedule a follow-up appointment so we can see how she is doing on this.  F/U: With behavioral health for therapy as scheduled    (J02.9) Acute pharyngitis, unspecified etiology  Comment: Possible post nasal drip related to allergy vs viral URI  Plan: RAPID STREP (POINT OF CARE), fluticasone         (FLONASE) 50 MCG/ACT nasal spray, THROAT         CULTURE BETA STREP        Ed: Possible etiology, use of Flonase nasal spray, Tylenol as needed increase fluids, signs and symptoms of worsening condition  F/U: based on results and prn if sx's persist or worsen

## 2021-08-03 LAB — THROAT CULTURE BETA STREP

## 2021-09-12 ENCOUNTER — Ambulatory Visit: Payer: No Typology Code available for payment source | Attending: Family Medicine | Admitting: Family Medicine

## 2021-09-12 ENCOUNTER — Other Ambulatory Visit: Payer: Self-pay

## 2021-09-12 VITALS — BP 129/79 | HR 72 | Temp 97.2°F | Ht <= 58 in | Wt 99.0 lb

## 2021-09-12 DIAGNOSIS — Z975 Presence of (intrauterine) contraceptive device: Secondary | ICD-10-CM | POA: Insufficient documentation

## 2021-09-12 DIAGNOSIS — Z30017 Encounter for initial prescription of implantable subdermal contraceptive: Secondary | ICD-10-CM | POA: Insufficient documentation

## 2021-09-12 LAB — URINE PREGNANCY TEST (POINT OF CARE): HCG QUALITATIVE URINE: NEGATIVE

## 2021-09-12 MED ORDER — ETONOGESTREL 68 MG SC IMPL
68.0000 mg | DRUG_IMPLANT | SUBCUTANEOUS | 0 refills | Status: DC
Start: 2021-09-12 — End: 2022-05-21

## 2021-09-12 NOTE — Patient Instructions (Signed)
Folheto Informativo  O Implante Anticoncepcional de Três Anos    O que é o implante contraceptivo?  O implante é um método contraceptivo que usa somente um tipo de hormônio, a progesterona, na prevenção de gravidez. O implante é um bastão em forma de palito de fósforo feito de plástico contendo hormônio. O médico usa uma agulha grande para injetar o implante embaixo da pele na parte de cima do braço. O hormônio é liberado aos poucos e previne gravidez por até 3 anos. O implante tem uma pequena quantidade de bário nele, de forma a poder ser visto por um raio-x (radiografia).                      Como funciona o implante?  O implante funciona impedindo a ovulação (a liberação de um óvulo do ovário da mulher).  Ele também age alterando a mucosidade no colo uterino (abertura do útero), deixando-a mais grossa e fazendo com que o esperma não entre no útero e fecunde (ou alcance) o óvulo. O implante também deixa o revestimento da parede do útero mais fina, impedindo a fixação do óvulo e, conseqüentemente, uma gravidez.    Qual é a eficácia do implante na prevenção de gravidez?  O implante funciona muito bem na prevenção de gravidez. Se 100 mulheres usarem o  implante por um ano, menos de uma mulher poderá engravidar. É parecido com o DIU e a Depo Provera. Porém, o implante pode ser menos eficiente se a mulher estiver acima do peso normal.    O que as mulheres gostam a respeito do implanon?  • O implante é um método contraceptivo muito eficiente.  • O implante previne gravidez por três anos.         • Este método contraceptivo é reversível (não é permanente). Uma vez que o implante for removido, a mulher geralmente engravida com facilidade.  • O implante pode ser removido por um médico a qualquer momento, se a mulher quiser engravidar, ou se não quiser mais usar o implante.                                                                                           • O implante é discreto (ninguém além de você precisa  saber).               • Com o uso do implante você não precisa interromper o ato sexual.  • Mulheres que não podem usar o hormônio estrogênio, podem usar o implante.  • Mulheres que estão amamentando podem usar o implante, começando 6 semanas após o parto.    O que as mulheres não gostam a respeito do implanon?  • O implante NÃO protege contra o HIV e outras doenças sexualmente transmissíveis (DSTs).  • Quase todas as mulheres que usam o implante terão mudanças no ciclo menstrual, como, sangramento ou manchas entre uma menstruação e outra, menstruação mais demorada, com mais fluxo ou com menos fluxo. Uma entre cada cinco mulheres não terá menstruação durante o uso do implante. Mudanças no sangramento menstrual é o motivo mais comum para que as mulheres deixem de usar o   implante.  • O implante deve ser inserido e removido por um médico.   • Se o implante não for inserido corretamente, algumas vezes pode ser difícil ou impossível de ser removido.   • Alguns medicamentos podem fazer com que o implante seja menos eficiente na prevenção de gravidez.  • Há uma grande chance de uma gravidez tubária (gravidez fora do útero), se este método falhar.    Quais são os possíveis efeitos colaterais durante o uso do implante?  • Mudanças no sangramento menstrual.  • Aumento de peso.  • Dores de cabeça.  • Acne.   • Dor ou sensibilidade nos seios.  • Mudanças de humor, nervosismo ou depressão.  • Menos desejo sexual.  • Dores de estômago, cólicas.  • Infecção vaginal ou mais corrimento vaginal.   • Dores nas costas.  • Náusea.  • Dor onde o implante foi inserido.  • Sentir tonturas.  • Séria reação alérgica.    Quais são os problemas que podem ocorrer quando o implante for inserido ou removido?  • Dor, irritação, inchaço ou manchas roxas.  • Cicatriz, incluindo uma cicatriz grossa chamada keloid.  • Infecção.    • O implante quebrar, o que torna muito difícil de ser removido.  • Cicatriz que torne difícil o implante de ser  removido.  • O implante sair sozinho.            • Precisar cirurgia no hospital para removê-lo, o que é muito raro de acontecer    As vezes o implante pode causar sérios problemas de saúde. NÃO use o implante se você tiver o seguinte:  • Doença no fígado.  • Histórico de coágulos sanguíneos nas pernas, pulmões ou olhos.  • Histórico de ataque do coração ou derrame.  • Sangramento vaginal anormal que não tenha sido avaliado por um médico.          • Câncer da mama ou acredita que possa ter câncer da mama.  • Alergia ao implante ou algum dos componentes.  • Alergia a anestesia usada no braço, antes do implante ser inserido ou removido.  • Está grávida ou acha que pode estar grávida.               Retorne ao centro de saúde o mais rápido possível se você sentir algum desses sintomas:       • Dores no peito, tossir sangue, ou sentir falta de ar repentina.              • Dores de cabeça, novo caso ou piora, tontura inesperada, vômito ou desmaio.  • Dor nas pernas.  • Mudanças na visão.  • Caroço nos seios.  • Sangramento vaginal muito forte.  • Depressão, novo caso ou piora.  • Dor no abdomen.  • Dor ou sangramento constante, onde o implante foi inserido ou retirado.            • Você não consegue sentir o implante no seu braço.   • Você acha que pode estar grávida      Entre em contato com o seu médico se você acha que pode ter uma doença sexualmente transmissível (DST), ou se você quiser parar de usar o implante e quiser começar a usar outro método.  ? Para diminuir o seu risco de contrair o HIV e outras doenças sexualmente transmissíveis (DSTs), use camisinha de látex cada vez que você fizer sexo vaginal, anal ou oral.  ? A [place] previne   gravidez quando for usada até 3-5 dias após o sexo sem proteção. Se você precisar tomar a [place] ou obter mais informações, telefone para o seu médico, para o orientador da clínica de planejamento familiar ou para o farmacêutico.

## 2021-09-12 NOTE — Progress Notes (Signed)
Nexplanon Insertion    40 year old female (BP 129/79    Pulse 72    Temp 97.2 F (36.2 C) (Temperature probe)    Ht 4\' 9"  (1.448 m)    Wt 44.9 kg (99 lb) ) presents for Nexplanon insertion. She was counseled thoroughly regarding risks, benefits, and side effects of Nexplanon and alternative methods of contraception. Informed written consent was obtained by MD.    The patient's last unprotected sex: not since last period    Implant users are counseled to abstain or use a backup method for the first 4 days.    If the patient had unprotected sex within the last 3 weeks she is counseled repeat a pregnancy test 3 weeks after last unprotected intercourse.      Time-out completed, documented by provider doing procedure or designated team member:  Correct patient: Yes  Correct procedure: Yes  Correct side, site, mark visible if applicable: Yes  Correct position: Yes  Special equipment/implant(s) present, if applicable: Yes  Alternative therapy discussed, if applicable: Yes      An appropriate avascular insertion site was chosen on the inner aspect of the upper left arm, non-dominant side, prepped with chloraprep . The area was infiltrated with 1% lidocaine. The Nexplanon rod was confirmed visually. The insertion trochar was introduced, the rod was placed in the subcutaneous/intradermal layer and was successfully palpated post-insertion by provider and patient. A pressure dressing was applied. The patient tolerated the procedure well.    POST PAIN ASSESSMENT:  Post pain assessment done. Patient rates pain as a 0 on a 0-10 pain scale.    Estimated blood loss: 1cc    The patient was provided with educational literature and the date of insertion and removal card.

## 2021-09-18 ENCOUNTER — Ambulatory Visit: Payer: No Typology Code available for payment source | Attending: Psychosomatic Medicine | Admitting: Mental Health

## 2021-09-18 ENCOUNTER — Encounter (HOSPITAL_BASED_OUTPATIENT_CLINIC_OR_DEPARTMENT_OTHER): Payer: Self-pay | Admitting: Mental Health

## 2021-09-18 DIAGNOSIS — F321 Major depressive disorder, single episode, moderate: Secondary | ICD-10-CM | POA: Insufficient documentation

## 2021-09-18 NOTE — Progress Notes (Signed)
ADULT OUTPATIENT PSYCHIATRY     THERAPY INITIAL NOTE TEMPLATE    REFERRED BY:    Asa Lente, APRN  [place],  [place] [address]    REASON FOR REFERRAL:  Ended relationship of 22 years with long time partner- Has been feeling depressed for the past 2 years and was able to manage. Anxiety/depression sx's have worsened the past 4 months and feels now she needs help.       CHIEF COMPLAINT: "I have been feeling anxious, and sad after my relationship ending.     HISTORY OF PRESENT ILLNESS:   -[place] interpreter assistance, evaluation conducted in [place]    Interested in therapy due to end of relationship of 22 years ending about 7 months ago..After pandemic, partner alcohol use started to increase, and infidelity. Has been feeling down for the past months. Endorses symptoms of depressed mood, low energy, increased tiredness, attempting to go to the gym;some anhedonia, decreased appetite, weight loss, low positive outlook on things, and sleep disturbance. Reports feeling bad about wasting her time in relationship.  Mood has been and down. Endorses depressed mood about 6 years ago after losing niece, endorses intermittent sadness in the past 2-3 years following loss, and relationship; not consistently. Feels more depressed this time around. Endorses anxiety such as worrying about a number of different things, onset:entire life. Physical symptoms includes primarily heart racing. Want to leave a situation when over thinking; laying in bed is helpful. Does not occur constantly. Denies difficulty concentration. Denies irritability.    Goals: "managing depressive symptoms, and cope with relationship ending".    CURRENT MEDICATIONS:    Current Outpatient Medications   Medication Sig    etonogestrel (NEXPLANON) 68 MG Implant 1 each by Subdermal route continuous    fluticasone (FLONASE) 50 MCG/ACT nasal spray 1 spray by Each Nostril route 2 (two) times daily as needed for Rhinitis     albuterol HFA (PROAIR HFA) 108 (90 Base) MCG/ACT inhaler Inhale 2 puffs into the lungs every 6 (six) hours as needed for Wheezing or Shortness of breath (when exposed to cat)    cholecalciferol 25 MCG (1000 UT) CAPS Take 1 capsule by mouth daily    diphenhydrAMINE (BENADRYL) 25 MG capsule Take 25 mg by mouth every 6 (six) hours as needed for Itching.     No current facility-administered medications for this visit.         LANGUAGE OF CARE:    [place]      LANGUAGE NEEDS MET:    English Used Effectively By Patient      MEDICAL HISTORY:  Past Medical History:  No date: HTN (hypertension)  06/17/2007: Irregular menstrual cycle  06/17/2007: Pelvic pain  No date: Pre-eclampsia     PROBLEM LIST:  Patient Active Problem List:     Developmental speech or language disorder     Open wound of wrist with complication     Mild intermittent asthma     Nexplanon in place      BIOLOGICAL FAMILY HISTORY/FAMILY CONSTELLATION:    Review of patient's family history indicates:  Problem: Thyroid      Relation: Mother          Age of Onset: (Not Specified)  Problem: In Good Health      Relation: Father          Age of Onset: (Not Specified)  Problem: Heart      Relation: Maternal Grandfather  Age of Onset: (Not Specified)  Problem: Heart      Relation: Maternal Uncle          Age of Onset: (Not Specified)          Comment: sudden, age 80  Problem: Cancer - Lung      Relation: Paternal Uncle          Age of Onset: (Not Specified)          Comment: non-smoker  Problem: Diabetes      Relation: FamHxNeg          Age of Onset: (Not Specified)       PAST PSYCHIATRIC HISTORY:   Past Psychiatric History: - 09/18/21 0936        PAST PSYCHIATRIC HISTORY    [place] Psychiatric Medications: Zoloft 45 mg;recently started-somewhat helpful     Family History of Psychiatric Disorders: 59 year old Sister (depression)     Trauma History: Hx of bullying due to speech impairment                   PSYCHOSOCIAL:   OP Psychosocial  Review - 09/18/21 0939        Psychosocial    Relationship Status Single     Primary Caretaker for Someone No   financially supports family and son in [place]    Providing self care at home? Yes     Cultural Preferences [place]     Needs Expressed Emotional     Peer Group Friends   partner's mother    Leisure and Recreation Activities exercise, traveling        Living Situation    Lives With Alone   partner recently left apartment after end of relationship    Living Setting Apartment        Income Information    Employment Full time     Income Source Employed        Legal Information    Current Legal Issues None     History of Legal Issues None               Social History: Grew up In [place] with mother, and younger sister. Dad was living in the [place];left [place] when she was [age] years old. Parents were divorced. Loved living in [place]. Worked Animator as a Marine scientist, attended nursing school. Enjoyed school. Good friend group. Moved to [place] at age 13 for additional job opportunities, and better life.  Moved here with recent former partner. Adjustment was hard for the first  six months due to missing son, and adapting to new culture. Currently employed as a Electrical engineer. Son is 49, currently resides in [place] with grandmother.         SUBSTANCE USE:     Substance Use - 10/02/21 2109        Alcohol    * Alcohol Use in past 12 months Y     Alcohol Amount 3-4 drinks     History of Blackouts No     History of Hallucinations No        Substance Use Screen    * Used chemicals (other than as prescribed) in the past 12 months? N     Any family history of substance abuse problems? further assessment indicated                    [place] RISK ASSESSMENTS:   Suicide:   C-SSRS OP Triage Screener - 09/18/21  [place] Triage Screener    Within the past month, have you wished you were dead or wished you could go to sleep and not wake up?  Yes     Within the past month, have you had  any actual thoughts of killing yourself?  No     In your lifetime, have you ever done anything, started to do anything, or prepared to do anything to end your life? No     C-SSRS OP Triage Screener Risk Score Low Risk     C-SSRS OP Triage Screener Risk Score 1                C-SSRS Risk Assessment - 09/18/21 0948        C-SSRS Risk Assessment    Suicidal Ideation Check Most Severe in Past Month Wish to be dead     Activating Events (Recent) Recent loss(es) or other significant negative event(s) (legal, financial, relationship, etc.) (Describe)     Treatment History Not receiving treatment     Protective Factors (Recent) Identifies reasons for living;Responsibility to family or others, living with family;Supportive social network or family;Engaged in work or school                 VIOLENCE:    Violence/Abuse Risk - 09/18/21 0950        Violence Risk    Self Inflicted Injury? Denies        Domestic Abuse Assessment    * Do you feel safe at home? Yes     * Do You Feel Unsafe in Your Relationship(s)? No     Abuse/Trauma History None     Abuse/Trauma (Current) None     Restraining Order? Not now and no past history                 Protective Factors:   Protective Factors - 09/18/21 0948        Protective Factors    Protective Factors (Recent) Identifies reasons for living;Responsibility to family or others, living with family;Supportive social network or family;Engaged in work or school                  [place]:       Violence: low (1)     Addiction: low (1)     ADDITIONAL SCREENINGS:   PHQ9 SCREENING 08/01/2021 08/01/2021 09/18/2021   Patient refused PHQ-9 - - -   Little interest or pleasure in doing things Not at all Several Days Several Days   Feeling down, depressed, or hopeless Several Days Several Days Several Days   Trouble falling asleep, staying asleep, or sleeping too much - Several Days Several Days   Feeling tired or having low energy - Nearly Every Day Nearly Every Day   Poor appetite or overeating -  Several Days Nearly Every Day   Feeling bad about yourself - or that you are a failure or have let yourself or your family down - Several Days Several Days   Trouble concentrating on things, such as school work, reading the newspaper, or watching television - Several Days Several Days   Moving or speaking slowly that other people could have noticed.  Or the opposite - being so fidgety or restless that you have been  moving around a lot more than ususal - Several Days Not at all   Thoughts that you would be better off dead or of hurting yourself in some way -  Not at all Not at all   TOTAL - 10 11   If you checked off any problems, how difficult have these problems made it for you to do your work, take care of things at home, or get along with people? - - -     GAD-7 FLOWSHEET 09/18/2021 08/01/2021 08/01/2021   Feeling Nervous/Anxious/ On Edge _0 Unable to Stop Worrying _1 Worrying Too Much about Different Things 3 1 -   Trouble Relaxing 3 1 -   Restless/Hard to Sit Still 1 0 -   Easily Annoyed/Irritable 0 1 -   Afraid As is Something Aweful 3 1 -   How difficult has it made you to do work, take care of things at home, or get along with other people Somewhat difficult - -   GAD-7 Score 14 6 -             MENTAL STATUS EXAMINATION:   Mental Status Exam - 10/02/21 2110        Mental Status Exam     General Appearance Appears younger than stated age;Clean     Behavior Cooperative;Good eye contact;Socially appropriate     Level of Consciousness Alert;WNL     Orientation Level Oriented x3     Attention/Concentration WNL     Mannerisms/Movements No abnormal mannerisms/movements     Speech Quality and Rate WNL     Speech Clarity --   sppech impairment    Speech Tone Normal vocal inflection     Vocabulary/Fund of Knowledge WNL     Memory Intact     Thought Process & Associations Goal-directed;Linear;Logical     Thought Content No abnormalities reported or observed     Hallucinations None     Suicidal Thoughts None      Homicidal Thoughts None     Mood Anxious;Depressed/Sad     Affect Congruent w/mood     Judgment Good      Insight Good                  BIO/PSYCHO/SOCIAL AND RISK FORMULATION(S):      Loralyn is a 40 year old cisgender women presenting to therapy for support with depression, and associated anxiety following end of a 22 year relationship with long term partner. Pt reports experiencing depression for the past two years. Symptoms has worsened in the past 4-5 months due to relationship ending. Symptoms include sleep disturbance, daily low mood, anhedonia, unintentional weight loss, negative view of self, passive SI, and worrying thoughts. Endorses longstanding history with anxiety since childhood. Psychiatric treatment is significant for medication trial of Zoloft for mood support. Family includes a sibling with depression, further assessment is indicated. Denies past suicidal intent, plan or past attempts. Denies auditory or visual hallucinations, denies symptoms of mania. Socially, dhr is employed with [employer]. She does not identify a strong support network as most of her friends, and family currently resides in [place]. Presentation is consistent with No diagnosis found., which we discussed at intake, with further evaluation indicated for symptoms and their duration. Discussed therapy options (primary care v therapist in community); Keshara would benefit from brief therapy for support with depression,, building self esteem after end of long term relationship.    Makenly's strengths include demonstrated resilience, future oriented, help-seeking, positive outlook despite recent stressors.       DIAGNOSES:     Current moderate episode of major depressive disorder without prior episode ([place])  SNOMED CT(R): MODERATE MAJOR DEPRESSION, SINGLE EPISODE     Anxiety   SNOMED CT(R): ANXIETY      PLAN:      Edwardine is unsure about therapy at this time;preference to think about it more. She will reach out to  clinician regarding decision, or questions/concerns   Coordinate with care team as needed    CARE PLAN/ EPISODES:  No linked episodes         Jeanella Anton, LICSW

## 2022-04-29 ENCOUNTER — Ambulatory Visit (HOSPITAL_BASED_OUTPATIENT_CLINIC_OR_DEPARTMENT_OTHER): Payer: Self-pay

## 2022-04-29 NOTE — Telephone Encounter (Signed)
Regarding: vaginal bleeding  ----- Message from Riccardo Dubin sent at 04/29/2022  2:57 PM EDT -----  Casey [name] [id number], 41 year old, female    Calls today:  Sick    What are the symptoms ; Patient states is having vaginal bleeding since she has nexplanon implant. States that bleeding smells bad  How long has patient been sick?   What has pt. tried at home       Person calling on behalf of patient: Patient (self)    Cleotis Lema NUMBER: [phone]  Best time to call back:   Cell phone:   Other phone:    Patient's language of care: [place]    Patient needs a [place] interpreter.    Patient's PCP: Ladell Pier., MD    Primary Care Home Site:  [facility]

## 2022-04-29 NOTE — Telephone Encounter (Signed)
Reason for Disposition  ? Irregular vaginal bleeding or spotting and has a birth control implant    Answer Assessment - Initial Assessment Questions  1. IMPLANT TYPE: "What type of implant are you using?"  (e.g., Implanon, Nexplanon, Norplant, Jadelle)         Nexplanon    2. IMPLANT START DATE: "When did you first start using the implant?"        09/12/2021    3. IMPLANT LOCATION: "Where is implant located?"         Left arm    4. SYMPTOM: "What is the main symptom (or question) you're concerned about?"        Vaginal bleeding ongoing    5. ONSET: "When did the bleeding start?"        Almost every day now since having implant in.  Bleeding will stop for 2 days, and then comes back and lasts for 3-4 days on average, and then stops and the cycle continues    6. VAGINAL BLEEDING: "Are you having any unusual vaginal bleeding?"      - NONE      - SPOTTING: spotting or pinkish / brownish mucous discharge; does not fill panty-liner or pad      - MILD: less than 1 pad / hour; less than patient's usual menstrual bleeding      - MODERATE: 1-2 pads / hour; small-medium blood clots (e.g., pea, grape, small coin), 1 menstrual cup  every 6 hours      - SEVERE: soaking 2 or more pads/hour for 2 or more hours; bleeding not contained by pads or tampons; 1 menstrual cup every 2 hours        Spotting- brown/pink, does not fill up pad    7. PAIN: "Is there any pain?" (Scale: 1-10; mild, moderate, severe).        No    8. PREGNANCY: "Are you concerned you might be pregnant?" "Are you having any symptoms of pregnancy (breast tenderness, nausea, etc)?"          No    9.  STD (STI): "Are you concerned about the possibility of a STD (STI)?" "Are you having unprotected sex (sex without a condom)?"        No    + odor when pt is bleeding no odor otherwise, started 2 months ago  No other discharge  No itching  No fever    Protocols used: ADULTS CONTRACEPTION - IMPLANT-P-OH    Offered televisit to pt for tomorrow, pt declined. Next in person  appt booked in clinic.   Pt aware to call back with worsening symptoms.     Priority: Urgent    Advised per nursing triage protocol.  Verbalized understanding and agreement with instructions and disposition.     Recommended disposition for patient:Disposition: See in Office within 2 weeks    Instructed patient to call back for any new, worsening, or worrisome symptoms or concerns any time day or night.

## 2022-05-09 ENCOUNTER — Other Ambulatory Visit: Payer: Self-pay

## 2022-05-09 ENCOUNTER — Ambulatory Visit: Payer: No Typology Code available for payment source | Attending: Internal Medicine | Admitting: Internal Medicine

## 2022-05-09 ENCOUNTER — Encounter (HOSPITAL_BASED_OUTPATIENT_CLINIC_OR_DEPARTMENT_OTHER): Payer: Self-pay | Admitting: Internal Medicine

## 2022-05-09 VITALS — BP 124/85 | HR 84 | Temp 97.8°F | Wt 98.8 lb

## 2022-05-09 DIAGNOSIS — N926 Irregular menstruation, unspecified: Secondary | ICD-10-CM | POA: Diagnosis not present

## 2022-05-09 DIAGNOSIS — D7589 Other specified diseases of blood and blood-forming organs: Secondary | ICD-10-CM

## 2022-05-09 DIAGNOSIS — N898 Other specified noninflammatory disorders of vagina: Secondary | ICD-10-CM | POA: Diagnosis not present

## 2022-05-09 LAB — CHLAMYDIA GC NAAT
CHLAMYDIA TRACHOMATIS NAAT: NEGATIVE
NEISSERIA GONORRHOEAE NAAT: NEGATIVE

## 2022-05-09 LAB — CBC WITH PLATELET
ABSOLUTE NRBC COUNT: 0 10*3/uL (ref 0.0–0.0)
HEMATOCRIT: 42.9 % (ref 34.1–44.9)
HEMOGLOBIN: 14.2 g/dL (ref 11.2–15.7)
MEAN CORP HGB CONC: 33.1 g/dL (ref 31.0–37.0)
MEAN CORPUSCULAR HGB: 33.3 pg (ref 26.0–34.0)
MEAN CORPUSCULAR VOL: 100.5 fl — ABNORMAL HIGH (ref 80.0–100.0)
MEAN PLATELET VOLUME: 10.4 fL (ref 8.7–12.5)
NRBC %: 0 % (ref 0.0–0.0)
PLATELET COUNT: 373 10*3/uL (ref 150–400)
RBC DISTRIBUTION WIDTH STD DEV: 46.8 fL — ABNORMAL HIGH (ref 35.1–46.3)
RED BLOOD CELL COUNT: 4.27 M/uL (ref 3.90–5.20)
WHITE BLOOD CELL COUNT: 8.8 10*3/uL (ref 4.0–11.0)

## 2022-05-09 LAB — URINE PREGNANCY TEST (POINT OF CARE): HCG QUALITATIVE URINE: NEGATIVE

## 2022-05-09 NOTE — Progress Notes (Signed)
Cc: vaginal odor    She has been having intermittent vaginal bleeding - small amt - spotting since she had explanon placed in Sept  She is having that out in 2 weeks - has apt w/ fam med    She denies any vaginal pain, pain with intercourse, vaginal discharge per se - she notes that the only problem is that she feels like when she has the spotting that she has a different odor  No vaginal itching/rash irritation    BP 124/85 (Site: RA, Position: Sitting, Cuff Size: Reg)   Pulse 84   Temp 97.8 F (36.6 C) (Temporal)   Wt 44.8 kg (98 lb 12.8 oz)   LMP 05/08/2022   SpO2 100%   BMI 21.38 kg/m   Heart: S1 and S2 normal, no murmurs, clicks, gallops or rubs. Regular rate and rhythm.   Lungs:  clear; no wheezes, rhonchi or rales.  Deferred pelvic exam given symptoms     DATA: hCG neg    ASSESSMENT & PLAN:  (N92.6) Irregular menstrual cycle  (primary encounter diagnosis)  Comment: not currently pregnant; this appears to be due to the Explanon- will have that removed soon - if symptoms don't resolve, will need additional workup   Plan: CBC WITH PLATELET, FERRITIN            (N89.8) Vaginal odor  Comment: she only has odor with her spotting which is a bit different -but will screen for following  Plan: VAGINITIS PANEL PCR, CHLAMYDIA GC NAAT        And has follow up if symptoms persist with Dr. Idelle Leech       The patient was ready to learn and no apparent learning or adherence barriers were identified. I explained the diagnosis and treatment plan, and the patient expressed understanding of the content. I attempted to answer any questions regarding the diagnosis and the proposed treatment.        follow-up will be scheduled for 2 weeks from now  she has been advised to call or return with any worsening or new problems

## 2022-05-10 LAB — VAGINITIS PANEL PCR
BACTERIAL VAGINOSIS: POSITIVE
CANDIDA GLABRATA: NEGATIVE
CANDIDA KRUSEI: NEGATIVE
CANDIDA SPECIES: NEGATIVE
TRICH VAGINALIS: NEGATIVE

## 2022-05-13 ENCOUNTER — Encounter (HOSPITAL_BASED_OUTPATIENT_CLINIC_OR_DEPARTMENT_OTHER): Payer: Self-pay | Admitting: Internal Medicine

## 2022-05-13 NOTE — Progress Notes (Signed)
Hi Team, please place her in TV tomorrow w/ me to discuss results. Thanks, [employer]

## 2022-05-14 ENCOUNTER — Ambulatory Visit: Payer: No Typology Code available for payment source | Attending: Internal Medicine | Admitting: Internal Medicine

## 2022-05-14 DIAGNOSIS — B9689 Other specified bacterial agents as the cause of diseases classified elsewhere: Secondary | ICD-10-CM | POA: Diagnosis present

## 2022-05-14 DIAGNOSIS — N760 Acute vaginitis: Secondary | ICD-10-CM | POA: Diagnosis present

## 2022-05-14 LAB — FOLATE: FOLATE: >20 ng/mL (ref 4.6–?)

## 2022-05-14 LAB — FERRITIN: FERRITIN: 73 ng/mL (ref 13–150)

## 2022-05-14 LAB — VITAMIN B12: VITAMIN B12: 1080 pg/mL (ref 232–1245)

## 2022-05-14 MED ORDER — METRONIDAZOLE 500 MG PO TABS
500.00 mg | ORAL_TABLET | Freq: Two times a day (BID) | ORAL | 0 refills | Status: AC
Start: 2022-05-14 — End: 2022-05-21

## 2022-05-14 MED FILL — METRONIDAZOL 500MG: 7 days supply | Qty: 14 | Fill #0

## 2022-05-14 NOTE — Progress Notes (Signed)
Cc: follow up labs    Discussed + bacterial vaginosis   Given symptoms will treat with metronidazole   We reviewed the use of metronidazole in detail including that she should avoid alcohol while she is using it. She should complete the full course. She might note slight stomach upset or change in her taste but if she experiences any other side effects then she should call the clinic to speak to the RN's.     Mild macrocytosis without anemia  She has been drinking EtOH on the weekends -not really clear how much - but I counseled her that in addition to stopped x 1 week for the metronidazole - she would be best to reduce so we can see if that's the cause of the macrocytosis  In addition, will add on B12 & folate to the blood work from last week - if normal, then I recommended that she decreased EtOH & repeat blood work with PCP in ~3 months to follow that    20 minutes was spent on the day of the visit on some or all of the following activities:  . Preparing to see the patient (eg, review of tests)  . Obtaining and/or reviewing separately obtained history  . Performing a medically appropriate examination and/or evaluation  . Counseling and educating the patient/family/caregiver  . Ordering medications, tests, or procedures  . Referring and communicating with other health care professionals   . Documenting clinical information in the electronic or other health record  . Independently interpreting results and  communicating results to the patient/family/caregiver  . Care coordination

## 2022-05-14 NOTE — Addendum Note (Signed)
Addended by: Gavin Pound on: 05/14/2022 02:58 PM     Modules accepted: Orders

## 2022-05-16 ENCOUNTER — Encounter (HOSPITAL_BASED_OUTPATIENT_CLINIC_OR_DEPARTMENT_OTHER): Payer: Self-pay | Admitting: Nurse Practitioner

## 2022-05-17 MED ORDER — ALBUTEROL SULFATE HFA 108 (90 BASE) MCG/ACT IN AERS
2.0000 | INHALATION_SPRAY | Freq: Four times a day (QID) | RESPIRATORY_TRACT | 5 refills | Status: DC | PRN
Start: 2022-05-17 — End: 2022-05-21

## 2022-05-17 NOTE — Telephone Encounter (Signed)
PER Patient (self), Casey [name] is a 41 year old female has requested a refill of albuterol HFA (PROAIR HFA) 108 (90 Base) MCG/ACT inhaler.    Last Office Visit: 05/14/22 with Revonda Humphrey    Other Med Adult:  Most Recent BP Reading(s)  05/09/22 : 124/85        Cholesterol (mg/dL)   Date Value   34/19/6222 145     LOW DENSITY LIPOPROTEIN DIRECT (mg/dL)   Date Value   97/98/9211 72     HIGH DENSITY LIPOPROTEIN (mg/dL)   Date Value   94/17/4081 71     TRIGLYCERIDES (mg/dL)   Date Value   44/81/8563 86         THYROID SCREEN TSH REFLEX FT4 (uIU/mL)   Date Value   06/25/2021 1.190         No results found for: TSH    No results found for: HGBA1C    No results found for: POCA1C      INR (no units)   Date Value   04/02/2010 0.9 (L)       SODIUM (mmol/L)   Date Value   03/19/2012 131 (L)       POTASSIUM (mmol/L)   Date Value   03/19/2012 3.9           CREATININE (mg/dl)   Date Value   14/97/0263 1.0       Documented patient preferred pharmacies:    [facility], [place], [place] - [address]  Phone: [phone] Fax: [phone]

## 2022-05-19 MED FILL — VENTOLIN HFA AER: 25 days supply | Qty: 18 | Fill #0

## 2022-05-21 ENCOUNTER — Other Ambulatory Visit: Payer: Self-pay

## 2022-05-21 ENCOUNTER — Ambulatory Visit: Payer: No Typology Code available for payment source | Attending: Family Medicine | Admitting: Family Medicine

## 2022-05-21 ENCOUNTER — Encounter (HOSPITAL_BASED_OUTPATIENT_CLINIC_OR_DEPARTMENT_OTHER): Payer: Self-pay | Admitting: Family Medicine

## 2022-05-21 VITALS — BP 129/88 | HR 108 | Temp 99.6°F | Ht <= 58 in | Wt 99.0 lb

## 2022-05-21 DIAGNOSIS — Z3009 Encounter for other general counseling and advice on contraception: Secondary | ICD-10-CM | POA: Insufficient documentation

## 2022-05-21 DIAGNOSIS — R051 Acute cough: Secondary | ICD-10-CM | POA: Diagnosis present

## 2022-05-21 DIAGNOSIS — Z3046 Encounter for surveillance of implantable subdermal contraceptive: Secondary | ICD-10-CM | POA: Diagnosis present

## 2022-05-21 DIAGNOSIS — J4520 Mild intermittent asthma, uncomplicated: Secondary | ICD-10-CM | POA: Diagnosis present

## 2022-05-21 MED ORDER — AZITHROMYCIN 250 MG PO TABS
ORAL_TABLET | ORAL | 0 refills | Status: AC
Start: 2022-05-21 — End: 2022-05-26

## 2022-05-21 MED ORDER — ALBUTEROL SULFATE HFA 108 (90 BASE) MCG/ACT IN AERS
2.0000 | INHALATION_SPRAY | Freq: Four times a day (QID) | RESPIRATORY_TRACT | 5 refills | Status: DC | PRN
Start: 2022-05-21 — End: 2022-11-20

## 2022-05-21 MED ORDER — NORGESTIMATE-ETH ESTRADIOL 0.25-35 MG-MCG PO TABS
1.00 | ORAL_TABLET | Freq: Every day | ORAL | 3 refills | Status: AC
Start: 2022-05-21 — End: 2023-05-21

## 2022-05-21 MED FILL — NORGEST/ETHI 0.25/35MG-MCG: 84 days supply | Qty: 84 | Fill #0 | Status: CP

## 2022-05-21 MED FILL — VENTOLIN HFA AER: 25 days supply | Qty: 9 | Fill #0

## 2022-05-21 MED FILL — AZITHROMYCIN 250MG: 5 days supply | Qty: 6 | Fill #0 | Status: CP

## 2022-05-21 NOTE — Progress Notes (Signed)
Nexplanon Removal    41 year old presents for Etonogestrel removal due to: irregular bleeding.    PATIENT/PROCEDURE VERIFICATION DOCUMENTATION  Correct patient: Yes  Correct procedure: Yes  Correct site, mark visible if applicable: Yes  Correct position: Yes  Special equipment/implant(s) present, if applicable: Yes    Time-out completed, documented by provider doing procedure or designated team member:  Rosealee Albee, MD    05/21/2022    9:06 AM    The existing implant was palpated in the left upper arm. After informed consent was obtained, the area just distal to the implant was injected with 2cc of Lidocaine 1% with epinephrine for local anesthesia. Then using betadine for cleansing, with sterile technique, a 74mm incision was made distal to the implant. The implant was removed using the pop out technique. The entire implant rod was removed. Hemostasis was achieved with pressure. A Steri-Strip and pressure wrap were then applied. The procedure was well tolerated without complications.    POST PAIN ASSESSMENT:  Post pain assessment done. Patient rates pain as a 0 on a 0-10 pain scale.    Estimated blood loss: 1cc    Advised to use back-up method or abstain x1 week if changing to a new method.  Instructed to call for any concerns.        .  Acute cough/ Mild intermittent asthma, unspecified whether complicated/smoking  One week of a cough and history of asthma has wheezing throughout lung bases, no crackles noted, normal oxygen saturation, speaking normally at her baseline  - covid test negative at home, running a low grade fever and feels like the last time she had penumonia  -sweating, feels like running a low grade fever  - treating for pneumonia, azithromycin prescribed, to sue albuterol q4-6 hours for now, albuterol refill sent to pharmacy  - encouraged steam  - encouraged eating foods with probiotics while taking antibiotics  - encouraged smoking cessation and to schedule a close follow up with PCP to  discuss this  - for any worsening to call the office rigth away ro go to the ER    Birth control counseling  - patient would like to start OCPs, smoking about 3 cigarettes a day, with this plus age reviewed at increased risk of blood clots and reviewed alternatives  - patient understands this risk and would like to proceed with ocps now with plan to quite smoking  - reviewed if wants to reconsider option can call office anytime and to schedule follow up with pcp re smoking cessation

## 2022-08-04 ENCOUNTER — Ambulatory Visit: Payer: No Typology Code available for payment source | Attending: Internal Medicine | Admitting: Internal Medicine

## 2022-08-04 ENCOUNTER — Other Ambulatory Visit: Payer: Self-pay

## 2022-08-04 ENCOUNTER — Encounter (HOSPITAL_BASED_OUTPATIENT_CLINIC_OR_DEPARTMENT_OTHER): Payer: Self-pay | Admitting: Internal Medicine

## 2022-08-04 VITALS — BP 132/88 | HR 83 | Ht <= 58 in | Wt 100.0 lb

## 2022-08-04 DIAGNOSIS — R5381 Other malaise: Secondary | ICD-10-CM | POA: Insufficient documentation

## 2022-08-04 DIAGNOSIS — F4323 Adjustment disorder with mixed anxiety and depressed mood: Secondary | ICD-10-CM | POA: Insufficient documentation

## 2022-08-04 DIAGNOSIS — R5383 Other fatigue: Secondary | ICD-10-CM | POA: Insufficient documentation

## 2022-08-04 DIAGNOSIS — Z124 Encounter for screening for malignant neoplasm of cervix: Secondary | ICD-10-CM | POA: Insufficient documentation

## 2022-08-04 LAB — VITAMIN D,25 HYDROXY: VITAMIN D,25 HYDROXY: 26 ng/mL — ABNORMAL LOW (ref 30.0–100.0)

## 2022-08-04 LAB — CBC WITH PLATELET
ABSOLUTE NRBC COUNT: 0 10*3/uL (ref 0.0–0.0)
HEMATOCRIT: 41.3 % (ref 34.1–44.9)
HEMOGLOBIN: 13.8 g/dL (ref 11.2–15.7)
MEAN CORP HGB CONC: 33.4 g/dL (ref 31.0–37.0)
MEAN CORPUSCULAR HGB: 33.2 pg (ref 26.0–34.0)
MEAN CORPUSCULAR VOL: 99.3 fl (ref 80.0–100.0)
MEAN PLATELET VOLUME: 10.5 fL (ref 8.7–12.5)
NRBC %: 0 % (ref 0.0–0.0)
PLATELET COUNT: 332 10*3/uL (ref 150–400)
RBC DISTRIBUTION WIDTH STD DEV: 44.5 fL (ref 35.1–46.3)
RED BLOOD CELL COUNT: 4.16 M/uL (ref 3.90–5.20)
WHITE BLOOD CELL COUNT: 9.8 10*3/uL (ref 4.0–11.0)

## 2022-08-04 LAB — COMPREHENSIVE METABOLIC PANEL
ALANINE AMINOTRANSFERASE: 16 U/L (ref 12–45)
ALBUMIN: 4.7 g/dL (ref 3.4–5.2)
ALKALINE PHOSPHATASE: 44 U/L — ABNORMAL LOW (ref 45–117)
ANION GAP: 10 mmol/L (ref 10–22)
ASPARTATE AMINOTRANSFERASE: 15 U/L (ref 8–34)
BILIRUBIN TOTAL: 0.3 mg/dL (ref 0.2–1.0)
BUN (UREA NITROGEN): 12 mg/dL (ref 7–18)
CALCIUM: 9.9 mg/dL (ref 8.5–10.5)
CARBON DIOXIDE: 26 mmol/L (ref 21–32)
CHLORIDE: 102 mmol/L (ref 98–107)
CREATININE: 0.7 mg/dL (ref 0.4–1.2)
ESTIMATED GLOMERULAR FILT RATE: >60 mL/min (ref >60)
Glucose Random: 84 mg/dL (ref 74–160)
POTASSIUM: 4.8 mmol/L (ref 3.5–5.1)
SODIUM: 139 mmol/L (ref 136–145)
TOTAL PROTEIN: 7.2 g/dL (ref 6.4–8.2)

## 2022-08-04 LAB — THYROID SCREEN TSH REFLEX FT4: THYROID SCREEN TSH REFLEX FT4: 2.17 u[IU]/mL (ref 0.270–4.200)

## 2022-08-04 MED ORDER — BUPROPION HCL ER (XL) 150 MG PO TB24
150.0000 mg | ORAL_TABLET | Freq: Every morning | ORAL | 2 refills | Status: DC
Start: 2022-08-04 — End: 2022-09-29

## 2022-08-04 MED FILL — BUPROPN HCL 150MG XL: 30 days supply | Qty: 30 | Fill #0

## 2022-08-04 NOTE — Progress Notes (Signed)
[facility]    CC: no energy    HPI: Casey [name] is a 41 year old patient who presents to clinic for:    - has no energy for anything - work, exercise. "If I could stay in bed all day I would"  - makes plans, then doesn't do anything  - no interest in doing fun things  - no interest in eating, but no stomach pain, no appetite  - not sleeping well, trouble to fall asleep, and wakes up multiple time  - separated 2 years ago, admits has been feeling depressed  - tried sertraline last year, tried for 2 weeks (started low dose) but make her nauseated, very slow  - was bleeding a lot with nexplanon, then was on ocp, not taking it. Is sexually acitve, had her period 2-3 weeks ago. Not using any protection, would be okay with pregnancy. Getting period regularly.   - no thoughts of self harm/injury  - drinks on the weekends with friends, maybe 3-4 at a time  - no other drugs  - does smoke cigarettes, 3-4 cigs at night after work. Would like to quit but hasn't worked long term.      Review of systems:   Negative except as above and for:      Medications, Allergies, Problem list, and Social history were all reviewed and updated in Epic as appropriate.       Physical Exam:  BP 132/88 (Site: LA, Position: Sitting, Cuff Size: Reg)   Pulse 83   Ht 4\' 9"  (1.448 m)   Wt 45.4 kg (100 lb)   LMP 07/15/2022   SpO2 100%   BMI 21.64 kg/m     Gen: well appearing, in no distress  Psych: mood is good, affect is bright with normal range, speech fluent and coherent  HEENT: PERRL, EOMI, no icterus. TMs clear, throat non-erythematous and without exudate.   NECK: no palpable lymph nodes, no thyroid enlargement or masses,   Pelvic:  Patient was placed in the lithotomy position.  Bright light examination reveals normal external genitalia. Normal urethra, perineum, and anus.  Speculum was introduced through the vaginal os.  Vaginal discharge was normal. The cervix was identified and a specimen was obtained for Pap smear-  cervix without discharge or lesions.      Previous records/data reviewed:  - med trial of sertraline 1 year ago    ASSESSMENT/PLAN:  (R53.81,  R53.83) Malaise and fatigue  (primary encounter diagnosis)  Comment: suspect depression is the cause, but will check labs  Plan: CBC WITH PLATELET, COMPREHENSIVE METABOLIC         PANEL, THYROID SCREEN TSH REFLEX FT4, VITAMIN         D,25 HYDROXY            (F43.23) Adjustment reaction with anxiety and depression  Comment: discussed tough cycle of poor motivation and inactivity  Leading to worse mood. She agreed this might be going on. We talked a bit about behavioral activation, making small, attainable goals. Accepted care partner f/u. Will try bupropion in addition. May help with quiting smoking as well!  Plan: buPROPion (WELLBUTRIN XL) 150 MG 24 hr tablet            (Z12.4) Screening for malignant neoplasm of cervix  Comment:   Plan: CYTOPATH, C/V, THIN LAYER, OBTAINING SCREEN PAP        SMEAR, HUMAN PAPILLOMAVIRUS (HPV)  The patient was ready to learn and no apparent learning or adherence barriers were identified. I explained the diagnosis and treatment plan, and the patient expressed understanding of the content. I attempted to answer any questions regarding the diagnosis and the proposed treatment.    Possible side effects of the new prescribed medication were explained, including agitation. We discussed the patient's current medications.  We discussed the importance of medication compliance. The patient expressed understanding and no barriers to adherence were identified.    she has been advised to call or return with any worsening or new problems        Ladell Pier, MD

## 2022-08-05 LAB — HUMAN PAPILLOMAVIRUS (HPV): HUMAN PAPILLOMAVIRUS: NEGATIVE

## 2022-08-07 ENCOUNTER — Encounter (HOSPITAL_BASED_OUTPATIENT_CLINIC_OR_DEPARTMENT_OTHER): Payer: Self-pay

## 2022-08-07 ENCOUNTER — Telehealth (HOSPITAL_BASED_OUTPATIENT_CLINIC_OR_DEPARTMENT_OTHER): Payer: Self-pay

## 2022-08-07 NOTE — Telephone Encounter (Signed)
1st   Attempt      This team member called the Patient tofollow-up for Depression.    On behalf of [facility], Donnel Saxon., MD      Patient (self) did not answer, this team member left a message asking for a call back # [phone] or [phone]. Pt can leave a message best time to call Pt back or mychart.    Pt has a Clinic and Care partner phone number in case pt wants to reach out.     Morene Crocker, 08/07/2022

## 2022-08-12 LAB — CYTOPATH, C/V, THIN LAYER

## 2022-08-13 ENCOUNTER — Encounter (HOSPITAL_BASED_OUTPATIENT_CLINIC_OR_DEPARTMENT_OTHER): Payer: Self-pay

## 2022-08-13 ENCOUNTER — Telehealth (HOSPITAL_BASED_OUTPATIENT_CLINIC_OR_DEPARTMENT_OTHER): Payer: Self-pay

## 2022-08-13 NOTE — Telephone Encounter (Addendum)
Care Partner Note    Type of encounter:  Initial Telephone Encounter    Patient was referred to care partner through:  Inbasket message from PCP or PA    Reason for encounter: depression      Assessment:    Female 41 years old. Pt works as a Financial trader. Pt lives by herself. Pt had  a partner for 23 years. She has a son who is 53 years old and lives in [place] with his grandmother.   Pt partner went crazy during the pandemic drinking a lot. They got separe,   have a year and a half, they separate.   Pt was depressed, pt start the medication 1 week ago,1 day was hard she was shaking, 2 day she was so happy, but her mouth was dry, now she notice is getting better the side effect, pt still has some difficult sleep. I shared some resources that can help her.   Pt will continue to check and she will call PCP if change.   Pt used to be in the church she stopped going to, pt has some friends she can talk to. Pt used to go to the GYM 3 times a week. Pt is planning to start soon. I shared some other resources with her that she can try, like meditation and BH apps, exercise she can do at home. Pt will try and she will call if she needs a follow up. Pt is not interested in therapy, she will let PCP know if change.     Appears to be a Step 2 patient on the Stepped Model of Care    Patient Active Problem List:     Developmental speech or language disorder     Open wound of wrist with complication     Mild intermittent asthma      Medication: buPROPion (WELLBUTRIN XL) 150 MG 24 hr tablet, Take 1 tablet by mouth every morning, Disp: 30 tablet, Rfl: 2  norgestimate-ethinyl estradiol (SPRINTEC 28) 0.25-35 MG-MCG per tablet, Take 1 tablet by mouth in the morning., Disp: 84 tablet, Rfl: 3  albuterol HFA (PROAIR HFA) 108 (90 Base) MCG/ACT inhaler, Inhale 2 puffs into the lungs every 6 (six) hours as needed for Wheezing or Shortness of breath (when exposed to cat), Disp: 1 each, Rfl: 5  cholecalciferol 25 MCG (1000 UT) CAPS, Take 1  capsule by mouth daily, Disp: 90 capsule, Rfl: 3  diphenhydrAMINE (BENADRYL) 25 MG capsule, Take 25 mg by mouth every 6 (six) hours as needed for Itching., Disp: , Rfl:     No current facility-administered medications on file prior to visit.      Relevant screening scores:  AWQ:   PHQ-9:       09/18/2021    10:50 AM 08/01/2021     9:55 AM 06/25/2021     2:07 PM   PHQ-9 TOTAL SCORE   Doc FlowSheet Total Score 11 10 25    MyChart Total Score 11       GAD-7:       09/18/2021    10:51 AM 08/01/2021     9:55 AM 06/25/2021     2:07 PM   GAD-7 Total   GAD-7 Score 14 6 20      Child/Adolescent:    How difficult have these problems made it for you to do your work, take care of things at home, or get along with people?  Somewhat difficult     Engagement/motivation for change (Reference)    Preparation  Primary brief Intervention provided during this encounter:  Psychoeducation, Emotional Support, Self Care Planning, Behavioral Activation/Action Planning , and Provided Resources    PLAN:     Continue her medication and follow up with PCP.     Work on physical, pleasant activities, stress release( relaxation technique) and BH app  In [place] "[place] ansiedade".     Pt has a Clinic and Care partner phone number in case pt wants to reach out.    Pt will call your primary care clinic with any further questions or concerns.      In the case of a life threatening emergency, pt will present to the emergency room or call 911.     TIME SPENT: 20-30 minutes     Morene Crocker, 08/13/2022

## 2022-08-14 ENCOUNTER — Encounter (HOSPITAL_BASED_OUTPATIENT_CLINIC_OR_DEPARTMENT_OTHER): Payer: Self-pay | Admitting: Internal Medicine

## 2022-09-07 ENCOUNTER — Encounter (HOSPITAL_BASED_OUTPATIENT_CLINIC_OR_DEPARTMENT_OTHER): Payer: Self-pay

## 2022-09-07 ENCOUNTER — Other Ambulatory Visit: Payer: Self-pay

## 2022-09-07 ENCOUNTER — Emergency Department
Admission: AD | Admit: 2022-09-07 | Discharge: 2022-09-07 | Disposition: A | Discharge status: Home / Self Care | Payer: No Typology Code available for payment source | Source: Ambulatory Visit | Attending: Emergency Medicine | Admitting: Emergency Medicine

## 2022-09-07 DIAGNOSIS — S060X0A Concussion without loss of consciousness, initial encounter: Secondary | ICD-10-CM | POA: Insufficient documentation

## 2022-09-07 DIAGNOSIS — S0990XA Unspecified injury of head, initial encounter: Secondary | ICD-10-CM | POA: Insufficient documentation

## 2022-09-07 DIAGNOSIS — W19XXXA Unspecified fall, initial encounter: Secondary | ICD-10-CM | POA: Insufficient documentation

## 2022-09-07 DIAGNOSIS — Y92002 Bathroom of unspecified non-institutional (private) residence single-family (private) house as the place of occurrence of the external cause: Secondary | ICD-10-CM | POA: Diagnosis not present

## 2022-09-07 DIAGNOSIS — Y93E1 Activity, personal bathing and showering: Secondary | ICD-10-CM | POA: Diagnosis not present

## 2022-09-07 MED ORDER — IBUPROFEN 600 MG PO TABS
600.00 mg | ORAL_TABLET | Freq: Three times a day (TID) | ORAL | 0 refills | Status: AC | PRN
Start: 2022-09-07 — End: 2022-09-14

## 2022-09-07 MED ORDER — ACETAMINOPHEN 325 MG PO TABS
650.00 mg | ORAL_TABLET | Freq: Four times a day (QID) | ORAL | 0 refills | Status: AC | PRN
Start: 2022-09-07 — End: 2022-09-14

## 2022-09-07 NOTE — UC Provider Notes (Signed)
The patient was seen primarily by me. UC nursing record was reviewed. Select prior records as available electronically through the Epic record were reviewed.    HPI:    Casey [name] is a 41 year old female patient who has a past medical history of HTN (hypertension), Irregular menstrual cycle (06/17/2007), Pelvic pain (06/17/2007), and Pre-eclampsia. The pt presents with a chief complaint of fall, head strike.  Patient states that a week ago she was in the flower shower, fell, hit the right side of her forehead on the faucet.  No LOC and was able to get up on her own.  Denies any cervical spine pain neck pain.  She comes in because she still has pain on the side of her forehead where she hit her head, specifically when she opens her mouth as big as you can.  Not on blood thinners.  Occasional nausea no vomiting.    Family Hx: Noncontributory for Fall      SocHx     ROS: Pertinent positives were reviewed as per the HPI above. All other systems were reviewed and are negative.  Stormy Fabian  Language of care: [place] Marya Amsler)  MRN: [id number]  PCP: Ladell Pier., MD  Mode of arrival to Urgent Care: Walk-in.  Chief complaint: Fall    Past Medical History/Problem list:  Past Medical History:  No date: HTN (hypertension)  06/17/2007: Irregular menstrual cycle  06/17/2007: Pelvic pain  No date: Pre-eclampsia  Patient Active Problem List:     Developmental speech or language disorder     Open wound of wrist with complication     Mild intermittent asthma    Past Surgical History: Past Surgical History:  No date: OB ANTEPARTUM CARE CESAREAN DLVR & POSTPARTUM      Comment:  age 59  No date: TONSILLECTOMY ONE-HALF AGE 1/>  No date: UNLISTED PROCEDURE PHARYNX ADENOIDS/TONSILS      Comment:  age  5  Social History:   Social History     Socioeconomic History    Marital status: Single     Spouse name: Not on file    Number of children: 1    Years of education: Not on file    Highest education level: Not on file    Occupational History    Occupation: housecleaning   Tobacco Use    Smoking status: Light Smoker     Packs/day: 0.25     Types: Cigarettes    Smokeless tobacco: Current    Tobacco comments:     3-4 cigs at home at night   Substance and Sexual Activity    Alcohol use: Yes     Alcohol/week: 0.0 standard drinks of alcohol     Comment: special occasions    Drug use: No    Sexual activity: Yes     Partners: Male     Birth control/protection: I.U.D.   Other Topics Concern    Military Service Not Asked    Blood Transfusions Not Asked    Caffeine Concern No     Comment: 1 cup coffee/day    Occupational Exposure Not Asked    Hobby Hazards Not Asked    Sleep Concern No    Stress Concern No    Weight Concern Not Asked    Special Diet Not Asked    Back Care Not Asked    Exercise Yes    Bike Helmet Not Asked    Seat Belt Not Asked    Self-Exams Yes   Social  History Narrative    2021: living with partner of 19 year. Son in [place], living with her mother and sister, he's in college, doing really well. 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    Social Determinants of Health  Financial Resource Strain: Not on file  Food Insecurity: Not on file  Transportation Needs: Not on file  Physical Activity: Not on file  Stress: Not on file  Social Connections: Not on file  Intimate Partner Violence: Not on file  Housing Stability: Not on file     Allergies: Review of Patient's Allergies indicates:   Cat dander              Shortness of Breath    Comment:Improved with albuterol.    Immunizations:   Immunization History   Administered Date(s) Administered    Covid-19 Vaccine [employer] - Purple Cap) 02/22/2020, 03/14/2020, 10/20/2020    Depo Provera 12/24/2004, 05/14/2005, 07/23/2005, 10/02/2005, 12/24/2005, 03/11/2006, 05/20/2006, 08/12/2006    Influenza Virus Quad Presv Free Vacc 6 Mo and Older, IM 12/16/2017, 09/16/2018, 09/19/2019    Influenza Virus Quad W/Presv Vacc 6 Mo and Older, IM 10/10/2016     PNEUMOCOCCAL POLYSACCHARIDE VACCINE v23 07/03/2020    Td 07/03/2020    Tdap 12/30/2006          Medications:  Prior to Admission Medications   Prescriptions Last Dose Informant Patient Reported? Taking?   albuterol HFA (PROAIR HFA) 108 (90 Base) MCG/ACT inhaler   No No   Sig: Inhale 2 puffs into the lungs every 6 (six) hours as needed for Wheezing or Shortness of breath (when exposed to cat)   buPROPion (WELLBUTRIN XL) 150 MG 24 hr tablet   No No   Sig: Take 1 tablet by mouth every morning   cholecalciferol 25 MCG (1000 UT) CAPS   No No   Sig: Take 1 capsule by mouth daily   diphenhydrAMINE (BENADRYL) 25 MG capsule   Yes No   Sig: Take 25 mg by mouth every 6 (six) hours as needed for Itching.   norgestimate-ethinyl estradiol (SPRINTEC 28) 0.25-35 MG-MCG per tablet   No No   Sig: Take 1 tablet by mouth in the morning.      Facility-Administered Medications: None     Physical Exam (ED Bed 03/03-A):  Patient Vitals for the past 999 hrs:   BP Temp Pulse Resp SpO2 Weight   09/07/22 1557 (!) 157/105 97.2 F 94 20 100 % 45.4 kg (100 lb)     GENERAL:  WDWN, no acute distress, non-toxic   SKIN:  Warm & Dry, no rash, no petechiae or purpurae.  HEAD:  NCAT. Sclerae are anicteric and aninjected, oropharynx is clear with moist mucous membranes. PERRL. EOMI. some tenderness right side of forehead.  Able to fully open her jaw.  No malocclusion.  NECK:  Supple  LUNGS:  Clear to auscultation bilaterally. No wheezes, rales, rhonchi.   HEART:  RRR.  No murmurs, rubs, or gallops.   ABDOMEN:  Soft, NTND.  No masses.  No involuntary guarding or rebound.   EXTREMITIES:  No obvious deformities.  NEUROLOGIC:  Alert; moves all extremities; speaking in clear fluent sentences. Normal gait without ataxia.  Cranial nerves completely intact.  Sensation intact.  Strength intact.      Medications Given in Urgent Care:    Medications - No data to display Radiology Results:  See UC COURSE   Lab Results:     Labs Reviewed -  No data to display     US  Pelvic Non-OB w Transvag, 3D, Duplex  CLINICAL INDICATION: 3 week hx bilateral pelvic pain-greater on the left    COMPARISON: 11/22/2014    LMP: 03/16/2021  TECHNIQUE:    Transabdominal and transvaginal pelvic sonography were performed. Transvaginal scanning was performed to better evaluate the uterus and adnexa. 3-D imaging was performed to evaluate the uterine contour and endometrium.  Color Doppler and spectral waveform analysis of the ovaries performed for flow.    FINDINGS:    Uterus: 9.5 cm x 4.4 cm x 3.3 cm. The myometrium is unremarkable.    Endometrium: 3 mm in thickness. The endometrium appears unremarkable.     Cervix: Unremarkable.    Right ovary:  3.8 cm x 4.5 cm x 3.5 cm.  The right ovary contains a 4 cm cyst with a single thin septation.    Left ovary:  2.2 cm x 2.0 cm x 1.3 cm. The left ovary appears unremarkable.     Doppler: There is normal arterial and venous flow of the ovaries with color Doppler and spectral waveform analysis.    Free fluid: None.    IMPRESSION:    Right ovarian cyst. (This is compatible with a functional cyst and does not require any follow-up by consensus standards).  No signs of torsion.        Reviewed and Electronically Signed by: Elzie Rings MD   Signed Date/Time: 03-26-2021 08:36:30             Other Results and OLD/PRIOR records information and data (e.g. ECG, visual acuity):  See UC COURSE   Urgent Care Medical Decision-making:  41 year old female presents with a chief complaint of right forehead pain after a fall a week ago.  At this point, no indication for any head imaging.  It is possible that she has suffered a minor TBI or concussion.  She has no pain along any of the facial bones including her mandible no malocclusion.  Place patient on Motrin and Tylenol, PMD follow-up, diagnosed with a concussion and given concussion precautions.       Patient/family educated on diagnosis(es); she states understanding and agreement with plan of care. Reasons to present to  the nearest ED were reviewed in detail. She agrees with this plan and disposition.    Disposition: Discharge    Condition on Discharge: Improved and Stable  Condition on Admission: Improved and Stable  Condition on Signout: Improved and Stable     Diagnosis/Diagnoses:  Concussion without loss of consciousness, initial encounter  Traumatic injury of head, initial encounter    Discharge Prescriptions:      Medication List        ASK your doctor about these medications      albuterol HFA 108 (90 Base) MCG/ACT inhaler  Commonly known as: ProAir HFA  Inhale 2 puffs into the lungs every 6 (six) hours as needed for Wheezing or Shortness of breath (when exposed to cat)     buPROPion 150 MG 24 hr tablet  Commonly known as: WELLBUTRIN XL  Take 1 tablet by mouth every morning     cholecalciferol 25 MCG (1000 UT) Caps  Take 1 capsule by mouth daily     diphenhydrAMINE 25 MG capsule  Commonly known as: BENADRYL     norgestimate-ethinyl estradiol 0.25-35 MG-MCG per tablet  Commonly known as: Sprintec 28  Take 1 tablet by mouth in the morning.  Helaine Chess, MD, Department of Emergency Medicine, [facility]  This urgent care patient encounter note was created using voice-recognition software and in real time while caring for patients.

## 2022-09-07 NOTE — Narrator Note (Signed)
Eval by md

## 2022-09-07 NOTE — Discharge Instructions (Signed)
You have been seen in the urgent care for your head injury.  At this time, I do not think that you need CT imaging of your head.  Please take Tylenol Motrin as prescribed for the pain.  Please follow-up with your primary care physician.  Based on your symptoms, it is likely that you have suffered a concussion a minor traumatic brain injury and your symptoms need to be followed.  If you are doing any activities that make you have a worsening headache such as reading watching TV, you should stop doing them as you need brain rest.

## 2022-09-07 NOTE — Narrator Note (Signed)
Patient Disposition  Patient education for diagnosis, medications, activity, diet and follow-up.  Patient left UC 4:09 PM.  Patient rep received written instructions.    Interpreter to provide instructions: No    Patient belongings with patient: YES    Have all existing LDAs been addressed? N/A    Have all IV infusions been stopped? N/A    Destination: Home

## 2022-09-07 NOTE — UC Triage Notes (Signed)
Fell 1 week ago. Hit head, no LOC. Difficulty opening jaw.

## 2022-09-28 NOTE — Progress Notes (Unsigned)
[facility]    CC: f/u energy    HPI: Casey [name] is a 41 year old patient who presents to clinic to follow up on their mood and for:    - low energy, and smoking  - has been able to cut down on smoking on the bupropion - donw to 3 cigs/day. Gets nauseous when smoking    - Mood has been: better, and energy is better  - worries: getting better  - sleep: sleeping well  - self care: trying to get to the gym more, "getting my life back"  - therapy: did have some communication with our care partner  - meds: taking bupropion, felt "funny" the first week, a bit energentic  - results: vit D was a bit low, recommended repletion, she has been taking this    Review of systems:   Negative except as above and for:      Medications, Allergies, Problem list, and Social history were all reviewed and updated in Epic as appropriate.       Physical Exam:  BP 120/80 (Site: LA, Position: Sitting, Cuff Size: Reg)   Pulse 89   Temp 97.4 F (36.3 C) (Temporal)   Ht 4\' 9"  (1.448 m)   Wt 45.4 kg (100 lb)   SpO2 99%   BMI 21.64 kg/m     Gen: well appearing, in no distress  Psych: mood is good, affect is bright with normal range, speech fluent and coherent    Previous records/data reviewed:  Vit D low    ASSESSMENT/PLAN:  (F43.23) Adjustment reaction with anxiety and depression  (primary encounter diagnosis)  Comment: doing much better with time, self care, and meds  Plan: buPROPion (WELLBUTRIN XL) 150 MG 24 hr tablet        Continue meds at least 6 months, re-group in spring    (F17.200) Tobacco use disorder  Comment: wants to quit, and smoking so little it should not be difficult, wants to use patch  Plan: use patch (already has), discussed lifestyle modification as well    (Z23) Need for prophylactic vaccination and inoculation against influenza  Comment:   Plan: IMMUNIZATION ADMIN SINGLE, IIV4 VACC PRESERV         FREE AGE 16 MONTHS AND OLDER, 0.5ML, IM            (Z23) COVID-19 vaccine administered  Comment:   Plan:  MODERNA >12YO COVID-19 FALL 2023                The patient was ready to learn and no apparent learning or adherence barriers were identified. I explained the diagnosis and treatment plan, and the patient expressed understanding of the content. I attempted to answer any questions regarding the diagnosis and the proposed treatment.  We discussed the patient's current medications.  We discussed the importance of medication compliance. The patient expressed understanding and no barriers to adherence were identified.    she has been advised to call or return with any worsening or new problems      I spent a total of 20 minutes on this visit on the date of service (total time includes all activities performed on the date of service)

## 2022-09-29 ENCOUNTER — Other Ambulatory Visit: Payer: Self-pay

## 2022-09-29 ENCOUNTER — Ambulatory Visit: Payer: No Typology Code available for payment source | Attending: Internal Medicine | Admitting: Internal Medicine

## 2022-09-29 VITALS — BP 120/80 | HR 89 | Temp 97.4°F | Ht <= 58 in | Wt 100.0 lb

## 2022-09-29 DIAGNOSIS — F4323 Adjustment disorder with mixed anxiety and depressed mood: Secondary | ICD-10-CM | POA: Insufficient documentation

## 2022-09-29 DIAGNOSIS — F17200 Nicotine dependence, unspecified, uncomplicated: Secondary | ICD-10-CM | POA: Diagnosis present

## 2022-09-29 DIAGNOSIS — Z23 Encounter for immunization: Secondary | ICD-10-CM | POA: Insufficient documentation

## 2022-09-29 MED ORDER — BUPROPION HCL ER (XL) 150 MG PO TB24
150.0000 mg | ORAL_TABLET | Freq: Every morning | ORAL | 1 refills | Status: DC
Start: 2022-09-29 — End: 2023-05-14

## 2022-11-20 ENCOUNTER — Encounter (HOSPITAL_BASED_OUTPATIENT_CLINIC_OR_DEPARTMENT_OTHER): Payer: Self-pay | Admitting: Internal Medicine

## 2022-11-20 ENCOUNTER — Encounter (HOSPITAL_BASED_OUTPATIENT_CLINIC_OR_DEPARTMENT_OTHER): Payer: Self-pay | Admitting: Family Medicine

## 2022-11-20 DIAGNOSIS — F4323 Adjustment disorder with mixed anxiety and depressed mood: Secondary | ICD-10-CM

## 2022-11-20 MED ORDER — ALBUTEROL SULFATE HFA 108 (90 BASE) MCG/ACT IN AERS
2.0000 | INHALATION_SPRAY | Freq: Four times a day (QID) | RESPIRATORY_TRACT | 5 refills | Status: DC | PRN
Start: 2022-11-20 — End: 2023-05-14

## 2022-11-20 NOTE — Telephone Encounter (Signed)
PER Patient (self), Casey [name] is a 41 year old female has requested a refill of      -  albuterol inh       Last Office Visit: 09-29-22 with pcp  Last Physical Exam: 07-03-2020     There are no preventive care reminders to display for this patient.     Other Med Adult:  Most Recent BP Reading(s)  09/29/22 : 120/80        Cholesterol (mg/dL)   Date Value   66/29/4765 145     LOW DENSITY LIPOPROTEIN DIRECT (mg/dL)   Date Value   46/50/3546 72     HIGH DENSITY LIPOPROTEIN (mg/dL)   Date Value   56/81/2751 71     TRIGLYCERIDES (mg/dL)   Date Value   70/12/7492 86         THYROID SCREEN TSH REFLEX FT4 (uIU/mL)   Date Value   08/04/2022 2.170         No results found for: "TSH"    No results found for: "HGBA1C"    No results found for: "POCA1C"      INR (no units)   Date Value   04/02/2010 0.9 (L)       SODIUM (mmol/L)   Date Value   08/04/2022 139       POTASSIUM (mmol/L)   Date Value   08/04/2022 4.8           CREATININE (mg/dL)   Date Value   49/67/5916 0.7        Documented patient preferred pharmacies:    [facility], [place], [place] - [address]  Phone: [phone] Fax: [phone]

## 2023-01-28 ENCOUNTER — Ambulatory Visit (HOSPITAL_BASED_OUTPATIENT_CLINIC_OR_DEPARTMENT_OTHER): Payer: Self-pay | Admitting: Registered Nurse

## 2023-01-28 NOTE — Telephone Encounter (Signed)
Reason for Disposition   [1] On treatment > 3 weeks AND [2] rash is not gone    Answer Assessment - Initial Assessment Questions  Pt calling with concern for ring worm x10day  Right not both of her legs have rashes-   +for 2 spots- one on each leg  One is tiny  The other affected area is about the size of face of a watch.  Is on otc rx for it,  Not helping  No pain  No itching  Not pregnant    Protocols used: Ringworm-A-AH  Advised per nursing triage protocol.  Verbalized understanding and agreement with instructions and disposition.     Recommended disposition for patient:Disposition: Scheduled Televisit Appointment    If patient referred to UC/ED advised that they may require further follow up and testing after the visit with their primary care office.     Instructed patient to call back for any new, worsening, or worrisome symptoms or concerns any time day or night.

## 2023-01-29 ENCOUNTER — Ambulatory Visit: Payer: No Typology Code available for payment source | Attending: Family Medicine | Admitting: Physician Assistant

## 2023-01-29 DIAGNOSIS — B354 Tinea corporis: Secondary | ICD-10-CM | POA: Diagnosis present

## 2023-01-29 MED ORDER — KETOCONAZOLE 2 % EX CREA
TOPICAL_CREAM | Freq: Every day | CUTANEOUS | 0 refills | Status: AC
Start: 2023-01-29 — End: 2023-02-28

## 2023-01-29 NOTE — Progress Notes (Signed)
Casey [name] is a 42 year old female presenting for a rash on her right upper thigh for the last 10 days.   It is a round well defined red macular lesion with a slightly raised border and central clearing that she says she knows is ring worm because she had it before.   She has been using an OTC fungal cream but she does not know the name and she says it is not helping.   No pruritus or pain.        ROS     Patient Active Problem List:     Developmental speech or language disorder     Open wound of wrist with complication     Mild intermittent asthma     Tobacco use disorder        albuterol HFA (PROAIR HFA) 108 (90 Base) MCG/ACT inhaler, Inhale 2 puffs into the lungs every 6 (six) hours as needed for Wheezing or Shortness of breath (when exposed to cat), Disp: 1 each, Rfl: 5  buPROPion (WELLBUTRIN XL) 150 MG 24 hr tablet, Take 1 tablet by mouth every morning, Disp: 90 tablet, Rfl: 1  norgestimate-ethinyl estradiol (SPRINTEC 28) 0.25-35 MG-MCG per tablet, Take 1 tablet by mouth in the morning., Disp: 84 tablet, Rfl: 3  cholecalciferol 25 MCG (1000 UT) CAPS, Take 1 capsule by mouth daily, Disp: 90 capsule, Rfl: 3  diphenhydrAMINE (BENADRYL) 25 MG capsule, Take 25 mg by mouth every 6 (six) hours as needed for Itching., Disp: , Rfl:     No current facility-administered medications on file prior to visit.       Review of Patient's Allergies indicates:   Cat dander              Shortness of Breath    Comment:Improved with albuterol.       Exam - vitals deferred in setting of COVID pandemic  Speaking in complete sentences, breathing comfortably  Over video she shows me the rash which is a round erythematous lesion with a raised border and central clearing.      Assessment/Plan:  1. Tinea corporis    - ketoconazole (NIZORAL) 2 % cream; Apply topically daily  Dispense: 30 g; Refill: 0  Told pt it could take 4-6 weeks to resolve.     Lawerance Cruel, PA-C

## 2023-03-09 ENCOUNTER — Encounter (HOSPITAL_BASED_OUTPATIENT_CLINIC_OR_DEPARTMENT_OTHER): Payer: Self-pay | Admitting: Internal Medicine

## 2023-03-09 ENCOUNTER — Other Ambulatory Visit: Payer: Self-pay

## 2023-03-09 ENCOUNTER — Ambulatory Visit: Payer: No Typology Code available for payment source | Attending: Internal Medicine | Admitting: Internal Medicine

## 2023-03-09 VITALS — BP 124/73 | HR 92 | Temp 98.1°F | Wt 104.0 lb

## 2023-03-09 DIAGNOSIS — L308 Other specified dermatitis: Secondary | ICD-10-CM

## 2023-03-09 NOTE — Progress Notes (Unsigned)
[facility]    CC: f/u rash    HPI: Casey [name] is a 42 year old patient who presents to clinic for:    - was seen about a month ago for "ringworm", OTC treatment for ringworm hadn't worked  - was given ketoconazole 1 month ago, that helped but it didn't go away  - Used it for a month  - got a new spot in the meantime  - all in all has been over a month  - starts small, gets bigger  - itchy, worse after shower  - skin seems dry      Review of systems:   Negative except as above and for:      Medications, Allergies, Problem list, and Social history were all reviewed and updated in Epic as appropriate.       Physical Exam:  BP 124/73 (Site: LA, Position: Sitting, Cuff Size: Reg)   Pulse 92   Temp 98.1 F (36.7 C) (Temporal)   Wt 47.2 kg (104 lb)   LMP 02/16/2023 (Approximate)   SpO2 100%   BMI 22.51 kg/m     Gen: well appearing, in no distress  Psych: mood is good, affect is bright with normal range, speech fluent and coherent  Skin: in the groin area are 3 ring-shapes lesions, central clearing with erythematous ring on the edge, not much scale    Previous records/data reviewed:      ASSESSMENT/PLAN:  1. Annular dermatitis  Didn't respond to usual antifungal treatments. Could be resistant fungus, or could be something different, like granuloma annulare or Erythema annulare centrifugum. Advised to stop the antifungal for now, in case she is having a reaction to it, and monitor. Advised to take a photo if it continue to get worse, and sent it so me, could do telederm, or try a topical steroid first if looks like granuloma annulare.  - FUNGAL CULTURE; Future  - FUNGAL CULTURE      The patient was ready to learn and no apparent learning or adherence barriers were identified. I explained the diagnosis and treatment plan, and the patient expressed understanding of the content. I attempted to answer any questions regarding the diagnosis and the proposed treatment.     We discussed the patient's  current medications.  We discussed the importance of medication compliance. The patient expressed understanding and no barriers to adherence were identified.    she has been advised to call or return with any worsening or new problems          Claria Dice, MD

## 2023-04-16 ENCOUNTER — Encounter (HOSPITAL_BASED_OUTPATIENT_CLINIC_OR_DEPARTMENT_OTHER): Payer: Self-pay | Admitting: Internal Medicine

## 2023-04-16 LAB — FUNGAL CULTURE

## 2023-05-14 ENCOUNTER — Encounter (HOSPITAL_BASED_OUTPATIENT_CLINIC_OR_DEPARTMENT_OTHER): Payer: Self-pay | Admitting: Internal Medicine

## 2023-05-14 ENCOUNTER — Encounter (HOSPITAL_BASED_OUTPATIENT_CLINIC_OR_DEPARTMENT_OTHER): Payer: Self-pay | Admitting: Family Medicine

## 2023-05-14 DIAGNOSIS — F4323 Adjustment disorder with mixed anxiety and depressed mood: Secondary | ICD-10-CM

## 2023-05-14 MED ORDER — BUPROPION HCL ER (XL) 150 MG PO TB24: 150.0000 mg | ORAL_TABLET | Freq: Every morning | ORAL | 1 refills | Status: DC

## 2023-05-14 MED ORDER — ALBUTEROL SULFATE HFA 108 (90 BASE) MCG/ACT IN AERS
2.00 | INHALATION_SPRAY | Freq: Four times a day (QID) | RESPIRATORY_TRACT | 5 refills | Status: AC | PRN
Start: 2023-05-14 — End: 2023-11-14

## 2023-05-14 NOTE — Telephone Encounter (Signed)
PER Patient (self), Casey [name] is a 42 year old female has requested a refill of      -  Wellbutrin and Albuterol Inhaler        Last Office Visit: 03/09/23 with PCP       There are no preventive care reminders to display for this patient.     Other Med Adult:  Most Recent BP Reading(s)  03/09/23 : 124/73        Cholesterol (mg/dL)   Date Value   16/09/9603 145     LOW DENSITY LIPOPROTEIN DIRECT (mg/dL)   Date Value   54/08/8118 72     HIGH DENSITY LIPOPROTEIN (mg/dL)   Date Value   14/78/2956 71     TRIGLYCERIDES (mg/dL)   Date Value   21/30/8657 86         THYROID SCREEN TSH REFLEX FT4 (uIU/mL)   Date Value   08/04/2022 2.170         No results found for: "TSH"    No results found for: "HGBA1C"    No results found for: "POCA1C"      INR (no units)   Date Value   04/02/2010 0.9 (L)       SODIUM (mmol/L)   Date Value   08/04/2022 139       POTASSIUM (mmol/L)   Date Value   08/04/2022 4.8           CREATININE (mg/dL)   Date Value   84/69/6295 0.7        Documented patient preferred pharmacies:    [facility], [place], [place] - [address]  Phone: [phone] Fax: [phone]

## 2023-05-20 ENCOUNTER — Ambulatory Visit (HOSPITAL_BASED_OUTPATIENT_CLINIC_OR_DEPARTMENT_OTHER): Payer: Self-pay | Admitting: Clinic/Center

## 2023-05-20 NOTE — Telephone Encounter (Signed)
Reason for Disposition  . Localized rash present > 7 days    Answer Assessment - Initial Assessment Questions  Call via portuguese telephone interpreter      Name and DOB conformed  42 YO female  Was seen 03/09/23 for skin rash   - Report symptoms had resold and now returned  -  rash in the pelvic  and leg area   - " it looks like wrong worm but my doctor did a test and it is not a fungus  - requesting f/u appointment    Protocols used: Rash or Redness - Localized-A-OH      Advised per nursing triage protocol.  Verbalized understanding and agreement with instructions and disposition.     Recommended disposition for patient:Disposition: See in Office within 3 days  Appointment scheduled 05/27/23 per patient's request  If patient referred to UC/ED advised that they may require further follow up and testing after the visit with their primary care office.     Instructed patient to call back for any new, worsening, or worrisome symptoms or concerns any time day or night.

## 2023-05-27 ENCOUNTER — Telehealth (HOSPITAL_BASED_OUTPATIENT_CLINIC_OR_DEPARTMENT_OTHER): Payer: Self-pay | Admitting: Internal Medicine

## 2023-05-27 ENCOUNTER — Ambulatory Visit: Payer: No Typology Code available for payment source | Attending: Family Medicine | Admitting: Family Medicine

## 2023-05-27 ENCOUNTER — Other Ambulatory Visit: Payer: Self-pay

## 2023-05-27 ENCOUNTER — Encounter (HOSPITAL_BASED_OUTPATIENT_CLINIC_OR_DEPARTMENT_OTHER): Payer: Self-pay | Admitting: Family Medicine

## 2023-05-27 VITALS — BP 116/76 | HR 88 | Temp 97.3°F | Wt 98.0 lb

## 2023-05-27 DIAGNOSIS — Z23 Encounter for immunization: Secondary | ICD-10-CM | POA: Insufficient documentation

## 2023-05-27 DIAGNOSIS — Z1231 Encounter for screening mammogram for malignant neoplasm of breast: Secondary | ICD-10-CM | POA: Insufficient documentation

## 2023-05-27 DIAGNOSIS — Z13220 Encounter for screening for lipoid disorders: Secondary | ICD-10-CM | POA: Diagnosis present

## 2023-05-27 DIAGNOSIS — R21 Rash and other nonspecific skin eruption: Secondary | ICD-10-CM | POA: Diagnosis present

## 2023-05-27 LAB — LIPID PANEL
Cholesterol: 150 mg/dL (ref 0–239)
HIGH DENSITY LIPOPROTEIN: 64 mg/dL (ref 40–60)
LOW DENSITY LIPOPROTEIN DIRECT: 86 mg/dL (ref 0–189)
TRIGLYCERIDES: 57 mg/dL (ref 0–150)

## 2023-05-27 LAB — HEMOGLOBIN A1C
ESTIMATED AVERAGE GLUCOSE: 91 mg/dL (ref 74–160)
HEMOGLOBIN A1C: 4.8 % (ref 4.0–5.6)

## 2023-05-27 NOTE — Progress Notes (Signed)
Subjective   Casey [name] is a 42 year old female presents with a concern of rash in her upper thigh and pubic area.  She states that she has had it for 2 weeks, although in the last 4 days it has completely resolved.  She showed me a picture of the rash from a few days ago.  She states it is slightly itchy and in the past has had a culture on it which was negative for fungus.  She states that the rash is red, circular lesions that are slightly itchy.  No significant scale or flaking.  No blisters and it is not painful.    Review of Patient's Allergies indicates:   Cat dander              Shortness of Breath    Comment:Improved with albuterol.            BP 116/76 (Site: LA, Position: Sitting, Cuff Size: Reg)   Pulse 88   Temp 97.3 F (36.3 C) (Temporal)   Wt 44.5 kg (98 lb)   SpO2 100%   BMI 21.21 kg/m   Objective     Physical Exam   Constitutional: She appears well-developed.   Eyes: Conjunctivae are normal. Right eye exhibits no discharge. Left eye exhibits no discharge. No scleral icterus.   Neurological: She is alert.   Skin: Skin is warm. She is not diaphoretic.   Vitals reviewed.  No rash.           Assessment     (R21) Rash and nonspecific skin eruption  (primary encounter diagnosis)  Comment: Resolved over the last 4 days, but the images and history sounds like a fungal infection.  Plan: I told her if it returns she can use miconazole or clotrimazole over-the-counter to see if that helps with the rash.    (Z23) Need for pneumococcal vaccination  Comment:   Plan: PCV20 VACCINE (PNEUMOCOCCAL/PREVNAR 20)             cholesterol screening  Comment:   Plan: LIPID PANEL, HEMOGLOBIN A1C                       Orders Placed This Encounter      [facility] Screening Mammo Bilateral Digital with DBT & CAD          Standing Status: Future          Standing Expiration Date: 07/26/2024          Order Specific Question: Additional imaging / imaging guided procedures as recommended:          Answer: Yes          Order  Specific Question: To assure imaging is appropriate for clinical symptoms or due to patient restrictions, this order may be changed to reflect patient needs.          Answer: Yes          Order Specific Question: Release result to patient via [facility]          Answer: Immediate [1]      PCV20 VACCINE (PNEUMOCOCCAL/PREVNAR 20)      Lipid Panel          Order Specific Question: Release result to patient via [facility]          Answer: Immediate [1]      Hemoglobin A1c          Order Specific Question: Release result to patient via [facility]  Answer: Immediate [1]           This note has been created with voice recognition software.   Please excuse any errors in transcription.  Occasional wrong word or sound alike substitution may have occurred due to the inherent limitations of voice recondition software.  Please read the chart carefully and recognize using contact with the substitution may have occurred.       Fredric Dine, MD

## 2023-05-27 NOTE — Progress Notes (Signed)
Casey [name],    your cholesterol was good and your blood sugar level was also very good.  Continue the great work.    If you have any questions, please contact the office.    Larita Fife M.D.

## 2023-05-27 NOTE — Telephone Encounter (Signed)
Central Refill Department to complete a benefit analysis for the PCV20  Vaccine.  The vaccine is covered under the patient's [employer] medical coverage.  Please choose Private

## 2023-06-15 ENCOUNTER — Ambulatory Visit (HOSPITAL_BASED_OUTPATIENT_CLINIC_OR_DEPARTMENT_OTHER): Payer: Self-pay

## 2023-06-15 NOTE — Telephone Encounter (Signed)
Reason for Disposition  . Side (flank) or lower back pain present    Answer Assessment - Initial Assessment Questions  Patient c/o urinary symptoms for the past 2 days  She c/o urinary frequency with urgency, blood in urine, burning with urination  Urinary hesitancy, 8/10 lower abdominal pain that is constant throughout the day. She reports to flank pain, dark yellow, cloudy urine  Thick white vaginal discharge, vaginal itching and vaginal sores  Patient denies fever or chills  Patient is sexually active with one partner. Her LMP was at the beginning of June, she's not able to recall the exact date.   Patient took an at home UTI test 2 days ago and it was positive.     No appointments available for today, RN advised patient to go to her nearest urgent care center to be evaluated. Patient declined do to work and requested an appointment for tomorrow.     Advised per nursing triage protocol.  Verbalized understanding and agreement with instructions and disposition.     Recommended disposition for patient:Disposition: See in Office Today    If patient referred to UC/ED advised that they may require further follow up and testing after the visit with their primary care office.     Instructed patient to call back for any new, worsening, or worrisome symptoms or concerns any time day or night.    Protocols used: Urinary Symptoms-A-OH

## 2023-06-16 ENCOUNTER — Other Ambulatory Visit: Payer: Self-pay

## 2023-06-16 ENCOUNTER — Ambulatory Visit: Payer: No Typology Code available for payment source | Attending: Family Medicine | Admitting: Family Medicine

## 2023-06-16 ENCOUNTER — Encounter (HOSPITAL_BASED_OUTPATIENT_CLINIC_OR_DEPARTMENT_OTHER): Payer: Self-pay | Admitting: Family Medicine

## 2023-06-16 VITALS — BP 111/70 | HR 89 | Temp 97.1°F | Wt 100.4 lb

## 2023-06-16 DIAGNOSIS — R300 Dysuria: Secondary | ICD-10-CM | POA: Diagnosis present

## 2023-06-16 DIAGNOSIS — B354 Tinea corporis: Secondary | ICD-10-CM | POA: Insufficient documentation

## 2023-06-16 DIAGNOSIS — N898 Other specified noninflammatory disorders of vagina: Secondary | ICD-10-CM | POA: Insufficient documentation

## 2023-06-16 LAB — POC URINALYSIS
BILIRUBIN, URINE: NEGATIVE
GLUCOSE,URINE: NEGATIVE
KETONE, URINE: NEGATIVE
LEUKOCYTE ESTERASE: NEGATIVE
NITRITE, URINE: NEGATIVE
PH URINE: 5.5 (ref 5.0–8.0)
PROTEIN, URINE: NEGATIVE
SPECIFIC GRAVITY, URINE: 1.025 (ref 1.003–1.030)
UROBILINOGEN URINE: 0.2 (ref 0.2–1.0)

## 2023-06-16 LAB — URINALYSIS
BACTERIA: >50 PER HPF — AB (ref 0–5)
BILIRUBIN, URINE: NEGATIVE
CASTS: NONE SEEN PER LPF
CRYSTALS: NONE SEEN
GLUCOSE, URINE: NEGATIVE MG/DL
KETONE, URINE: NEGATIVE MG/DL
LEUKOCYTE ESTERASE: NEGATIVE
NITRITE, URINE: NEGATIVE
PH URINE: 5.5 (ref 5.0–8.0)
PROTEIN, URINE: NEGATIVE MG/DL
SPECIFIC GRAVITY URINE: 1.018 (ref 1.003–1.035)
SQUAMOUS EPITHELIAL CELLS: >10 PER LPF — AB (ref 0–4)

## 2023-06-16 MED ORDER — CLOTRIMAZOLE 1 % EX CREA
TOPICAL_CREAM | Freq: Two times a day (BID) | CUTANEOUS | 1 refills | Status: AC
Start: 2023-06-16 — End: 2023-07-16

## 2023-06-16 MED ORDER — PHENAZOPYRIDINE HCL 200 MG PO TABS
200.00 mg | ORAL_TABLET | Freq: Three times a day (TID) | ORAL | 0 refills | Status: AC | PRN
Start: 2023-06-16 — End: 2023-06-18

## 2023-06-16 NOTE — Progress Notes (Signed)
S: Casey [name] is a 42 year old female here with urinary sx's and has concerns about an intermittent rash she was having and had been seen here for previously.      Has noticed burning with urination past 2 days  Small frequent voids  Noticed blood x 2 with urination yesterday  Thought she was getting her menses but it wasn't  from the vagina  No blood since  Has some discomfort/pressure over the bladder area  Has vaginal itch that started yesterday-is worse today  Has some vaginal discharge. Whitish.  Menses regular. No bleeding/spotting in between. No pain with IC  Denies fever, chills, N/V or flank pain.  SA with same female partner  x 1 year  Mutually monogamous  Not using anything to prevent pregnancy. Was on COCP's but stopped. May restart-not sure. Does not desire pregnancy. Is not interested in any other method of contraception    Has had some issues with a rash intermittently in the pubic and groin area for the past month. Slightly pruritic at times. Looks like circles of rash she says  Not painful or burning. No blisters.  Does shave her pubic hair.  Last shaved a week ago however the rash was present prior to shaving.  No change in soaps, lotions or detergents.  No one else at home with the rash.    BP 111/70 (Site: LA, Position: Sitting, Cuff Size: Reg)   Pulse 89   Temp 97.1 F (36.2 C) (Temporal)   Wt 45.5 kg (100 lb 6.4 oz)   LMP 05/19/2023 (Approximate)   SpO2 100%   BMI 21.73 kg/m     O:  PHYSICAL EXAM  General: well appearing, in no distress  Vitals: vital signs reviewed-see nurses notes  HEENT: Eyes: sclera clear  Neck: supple, no LAD; thyroid not enlarged.  Lungs: clear to auscultation, without rales, rhonci or wheezing  Cardiovascular: Regular rhythm, normal sounds and absence of murmurs, rubs or gallops  Abdomen: soft, unremarkable and without evidence of organomegaly or masses. No guarding. No CVA tenderness  GU:  Suprapubic area: pink rash well demarcated-confluent circular  areas with some pink slightly papular lesions right groin.  Extremities: Non edematous       POC U dip negative except for small blood      A/P: (R30.0) Dysuria  (primary encounter diagnosis)  Comment: U dip not impressive except for small blood.  Will send formal UA and UC.  ? Burning related to vaginal sx's ie yeast  Plan: POC URINALYSIS, URINALYSIS, URINE CULTURE,         phenazopyridine (PYRIDIUM) 200 MG tablet,         CHLAMYDIA GC NAAT, VAGINITIS PANEL NAAT        Discussed possible etiology.  Discussed will start some Pyridium to help with her symptoms in the meantime.  Discussed dose, use and side effects.  Discussed rationale for tests.  Discussed increased fluids.  Discussed signs and symptoms of worsening condition.  F/U: based on results and prn if sx's persist or worsen    (N89.8) Vaginal discharge  Comment: Past couple of days.  Had her do a self vaginitis swab.  Plan: CHLAMYDIA GC NAAT, VAGINITIS PANEL NAAT        Ed: possible etiology, cause/prevention BV and yeast, treatment for BV/yeast, s/s worsening condition  F/U: based on results and prn if sx's persist or worsen    (B35.4) Tinea corporis  Comment: Suspect rash in the suprapubic and groin area is potentially  fungal.  Will try clotrimazole cream.  Plan: clotrimazole (LOTRIMIN AF) 1 % cream        Discussed tinea corporis.  Discussed use of cream twice a day for 3 to 4 weeks.  F/U: RTC prn if sx's persist or worsen

## 2023-06-17 ENCOUNTER — Other Ambulatory Visit (HOSPITAL_BASED_OUTPATIENT_CLINIC_OR_DEPARTMENT_OTHER): Payer: Self-pay | Admitting: Family Medicine

## 2023-06-17 ENCOUNTER — Telehealth (HOSPITAL_BASED_OUTPATIENT_CLINIC_OR_DEPARTMENT_OTHER): Payer: Self-pay

## 2023-06-17 LAB — VAGINITIS PANEL NAAT
BACTERIAL VAGINOSIS: POSITIVE
CANDIDA GLABRATA: NEGATIVE
CANDIDA GROUP: NEGATIVE
TRICHOMONAS VAGINALIS: NEGATIVE

## 2023-06-17 LAB — CHLAMYDIA GC NAAT
CHLAMYDIA TRACHOMATIS NAAT: NEGATIVE
NEISSERIA GONORRHOEAE NAAT: NEGATIVE

## 2023-06-17 LAB — URINE CULTURE: URINE CULTURE/COLONY COUNT: NO GROWTH

## 2023-06-17 MED ORDER — METRONIDAZOLE 500 MG PO TABS
500.00 mg | ORAL_TABLET | Freq: Two times a day (BID) | ORAL | 0 refills | Status: AC
Start: 2023-06-17 — End: 2023-06-24

## 2023-06-17 NOTE — Telephone Encounter (Addendum)
-----   Message from Lawson Fiscal sent at 06/17/2023 12:12 PM EDT -----  Dear RN,    Please:    1. Create Telephone encounter for this patient.  2. Share with the patient : Please call and let her know that her vaginitis panel showed BV which is not sexually transmitted. No yeast. I am sending an Rx to the pharmacy for Metronidazole 500 mg po BID x 7 days. No intercourse during tx to let the normal bacterial balance in the vagina return to normal. Her GC/chlamydia was negative. The urinalysis looks like maybe she didn't wipe off quite enough so not a  great clean catch spec but no white blood cells or red blood cells or nitrites to indicate infection. She should be drinking plenty of water. Update: Please let her know that her urine culture was negative-no UTI. She should increase her fluids and take the Metronidazole as her sx's may be related to the BV. If after tx her sx's persist she should see her PCP for follow up.    Plan:  1. As above    2. Type of Outreach: 3 phone calls and if unable to reach send letter    3. Document the conversation in the Telephone Encounter and close the encounter, no need to send back to me.     Thank you,  Lawson Fiscal, APRN

## 2023-06-17 NOTE — Progress Notes (Signed)
Dear RN,    Please:    1. Create Telephone encounter for this patient.  2. Share with the patient : Please call and let her know that her vaginitis panel showed BV which is not sexually transmitted. No yeast. I am sending an Rx to the pharmacy for Metronidazole 500 mg po BID x 7 days. No intercourse during tx to let the normal bacterial balance in the vagina return to normal. Her GC/chlamydia was negative. The urinalysis looks like maybe she didn't wipe off quite enough so not a  great clean catch spec but no white blood cells or red blood cells or nitrites to indicate infection at this point. Will wait and see what the urine culture shows.  She should be drinking plenty of water.    Plan:  1. As above    2. Type of Outreach: 3 phone calls and if unable to reach send letter    3. Document the conversation in the Telephone Encounter and close the encounter, no need to send back to me.     Thank you,  Lawson Fiscal, APRN

## 2023-06-19 NOTE — Telephone Encounter (Signed)
Second attempt at calling pt via [place] interpreter ID #[id number]  Confirmed pt's name and DOB    Reiterated message from Lawson Fiscal, APRN  Informed pt that RX for metronidazole has been sent to her pharmacy   Pt states that she already has picked up the medication, and has been taking it as prescribed  Advised pt to continue taking the RX as prescribed until all the pills are gone   Pt understands and has no further questions        Benard Halsted, RN, 06/19/2023

## 2023-07-30 ENCOUNTER — Ambulatory Visit
Admission: RE | Admit: 2023-07-30 | Discharge: 2023-07-30 | Disposition: A | Discharge status: Home / Self Care | Payer: No Typology Code available for payment source | Source: Ambulatory Visit | Attending: Student in an Organized Health Care Education/Training Program | Admitting: Student in an Organized Health Care Education/Training Program

## 2023-07-30 ENCOUNTER — Other Ambulatory Visit (HOSPITAL_BASED_OUTPATIENT_CLINIC_OR_DEPARTMENT_OTHER): Payer: Self-pay | Admitting: Internal Medicine

## 2023-07-30 ENCOUNTER — Other Ambulatory Visit: Payer: Self-pay

## 2023-07-30 ENCOUNTER — Encounter (HOSPITAL_BASED_OUTPATIENT_CLINIC_OR_DEPARTMENT_OTHER): Payer: Self-pay

## 2023-07-30 DIAGNOSIS — Z1231 Encounter for screening mammogram for malignant neoplasm of breast: Secondary | ICD-10-CM | POA: Diagnosis present

## 2023-07-30 DIAGNOSIS — R928 Other abnormal and inconclusive findings on diagnostic imaging of breast: Secondary | ICD-10-CM | POA: Diagnosis present

## 2023-08-06 ENCOUNTER — Other Ambulatory Visit: Payer: Self-pay

## 2023-08-06 ENCOUNTER — Other Ambulatory Visit (HOSPITAL_BASED_OUTPATIENT_CLINIC_OR_DEPARTMENT_OTHER): Payer: Self-pay | Admitting: Internal Medicine

## 2023-08-06 ENCOUNTER — Ambulatory Visit
Admission: RE | Admit: 2023-08-06 | Discharge: 2023-08-06 | Disposition: A | Discharge status: Home / Self Care | Payer: No Typology Code available for payment source | Attending: Diagnostic Radiology | Admitting: Diagnostic Radiology

## 2023-08-06 DIAGNOSIS — R928 Other abnormal and inconclusive findings on diagnostic imaging of breast: Secondary | ICD-10-CM

## 2023-08-06 DIAGNOSIS — N6314 Unspecified lump in the right breast, lower inner quadrant: Secondary | ICD-10-CM | POA: Insufficient documentation

## 2024-02-11 ENCOUNTER — Other Ambulatory Visit: Payer: Self-pay

## 2024-02-11 ENCOUNTER — Encounter (HOSPITAL_BASED_OUTPATIENT_CLINIC_OR_DEPARTMENT_OTHER): Payer: Self-pay

## 2024-02-11 ENCOUNTER — Other Ambulatory Visit (HOSPITAL_BASED_OUTPATIENT_CLINIC_OR_DEPARTMENT_OTHER): Payer: Self-pay | Admitting: Internal Medicine

## 2024-02-11 ENCOUNTER — Ambulatory Visit
Admission: RE | Admit: 2024-02-11 | Discharge: 2024-02-11 | Disposition: A | Discharge status: Home / Self Care | Payer: No Typology Code available for payment source | Attending: Internal Medicine | Admitting: Internal Medicine

## 2024-02-11 DIAGNOSIS — N6489 Other specified disorders of breast: Secondary | ICD-10-CM | POA: Diagnosis present

## 2024-02-11 DIAGNOSIS — R928 Other abnormal and inconclusive findings on diagnostic imaging of breast: Secondary | ICD-10-CM

## 2024-07-07 ENCOUNTER — Ambulatory Visit: Attending: Physician Assistant | Admitting: Physician Assistant

## 2024-07-07 ENCOUNTER — Other Ambulatory Visit: Payer: Self-pay

## 2024-07-07 ENCOUNTER — Encounter (HOSPITAL_BASED_OUTPATIENT_CLINIC_OR_DEPARTMENT_OTHER): Payer: Self-pay | Admitting: Physician Assistant

## 2024-07-07 VITALS — BP 124/79 | HR 73 | Temp 97.4°F | Wt 101.8 lb

## 2024-07-07 DIAGNOSIS — R5383 Other fatigue: Secondary | ICD-10-CM | POA: Insufficient documentation

## 2024-07-07 DIAGNOSIS — R5381 Other malaise: Secondary | ICD-10-CM | POA: Diagnosis not present

## 2024-07-07 DIAGNOSIS — M791 Myalgia, unspecified site: Secondary | ICD-10-CM | POA: Diagnosis present

## 2024-07-07 LAB — CBC WITH PLATELET
ABSOLUTE NRBC COUNT: 0 TH/uL (ref 0.0–0.0)
HEMATOCRIT: 39.6 % (ref 34.1–44.9)
HEMOGLOBIN: 13.2 g/dL (ref 11.2–15.7)
MEAN CORP HGB CONC: 33.3 g/dL (ref 31.0–37.0)
MEAN CORPUSCULAR HGB: 32.9 pg (ref 26.0–34.0)
MEAN CORPUSCULAR VOL: 98.8 fl (ref 80.0–100.0)
MEAN PLATELET VOLUME: 10.5 fL (ref 8.7–12.5)
NRBC %: 0 % (ref 0.0–0.0)
PLATELET COUNT: 334 TH/uL (ref 150–400)
RBC DISTRIBUTION WIDTH STD DEV: 44.4 fL (ref 35.1–46.3)
RED BLOOD CELL COUNT: 4.01 M/uL (ref 3.90–5.20)
WHITE BLOOD CELL COUNT: 8 TH/uL (ref 4.0–11.0)

## 2024-07-07 LAB — THYROID SCREEN TSH REFLEX FT4: THYROID SCREEN TSH REFLEX FT4: 1.43 u[IU]/mL (ref 0.270–4.200)

## 2024-07-07 LAB — VITAMIN D,25 HYDROXY: VITAMIN D,25 HYDROXY: 39 ng/mL (ref 30.0–100.0)

## 2024-07-07 LAB — FOLLICLE STIMULATING HORMONE: FOLLICLE STIMULATING HORMONE: 8.4 m[IU]/mL

## 2024-07-07 LAB — ESTRADIOL: ESTRADIOL: 115 pg/mL

## 2024-07-07 NOTE — Progress Notes (Signed)
 HISTORY OF PRESENT ILLNESS    Casey [name] is a 43 year old female who presents today with concerns about:    Feeling very fatigued and low energy for the last 6 months but getting worse recently.   Does not have the motivation to go to the gym anymore and she used to really enjoy it.   No significant weight or appetite changes.   Menses are still monthly and very regular but are now very light which they never were.   She stopped her COCP this past December since she wanted to flush her system out.   She does smoke cigarettes but not daily.   No family or personal h/o clotting disorders.     Pt has had a very stressful last 2 years.   She got divorced and is now living alone. She is still struggling with this.         REVIEW OF SYSTEMS    Pertinent positives and negatives per HPI above.       BP 124/79 (Site: LA, Position: Sitting, Cuff Size: Reg)   Pulse 73   Temp 97.4 F (36.3 C) (Temporal)   Wt 46.2 kg (101 lb 12.8 oz)   LMP 05/24/2024 (Exact Date)   SpO2 100%   BMI 22.03 kg/m     PHYSICAL EXAM   General: Well appearing, alert and in no acute distress.   Psych: Mood and affect normal.  Neck: No thyroid  enlargement or nodules. No cervical adenopathy.  Cardiovascular: RRR. No murmurs, gallops or rubs.  No edema.   Pulmonary/Chest: No respiratory distress. Lungs clear to auscultation bilaterally.  Musculoskeletal: Normal ROM bilaterally with no deformities.     ASSESSMENT & PLAN    (R53.81,  R53.83) Malaise and fatigue  Comment: Possibly due to perimenopause due to the change in her menses. Could also be related to depression due to recent stresses ith her divorce and now being along.   Plan: Check VITAMIN D ,25 HYDROXY, CBC WITH PLATELET,         THYROID  SCREEN TSH REFLEX FT4, ESTRADIOL ,         FOLLICLE STIMULATING HORMONE  If labs are normal will discuss options for treating depression and possibly some menopausal symptoms with SSRI.     Myalgia  Pt also feels her whole body aches the week before  her menses.   -Check vit D    I spent a total of 31 minutes on this visit on the date of service (total time includes all activities performed on the date of service)                  The patient indicates understanding of these issues and agrees with the plan.   No apparent learning barriers were identified. I attempted to answer any questions regarding the diagnosis and the proposed treatment.  The patient was given an After Visit Summary sheet that lists all of their medications with directions, their allergies, orders placed during this encounter, and follow- up instructions.    Casey Canny, PA-C

## 2024-07-11 ENCOUNTER — Ambulatory Visit (HOSPITAL_BASED_OUTPATIENT_CLINIC_OR_DEPARTMENT_OTHER): Payer: Self-pay | Admitting: Physician Assistant

## 2024-08-08 IMAGING — MR RM - MAMA
11 series · 16 of 16 positions shown · non-contrast
Comparison: none

[Series 3: T1 · axial · 3.0mm · 0.72mm/px · 1 of 50 slices shown (1 of 3)]
[im 1/50]
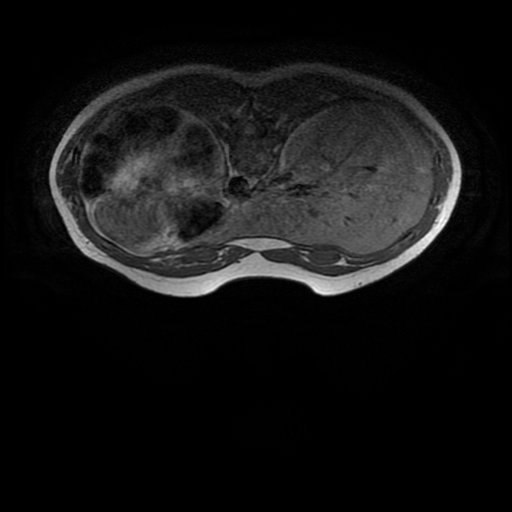

[Series 4: T2 · axial · 3.0mm · 0.72mm/px · 1 of 50 slices shown]
[im 1/50]
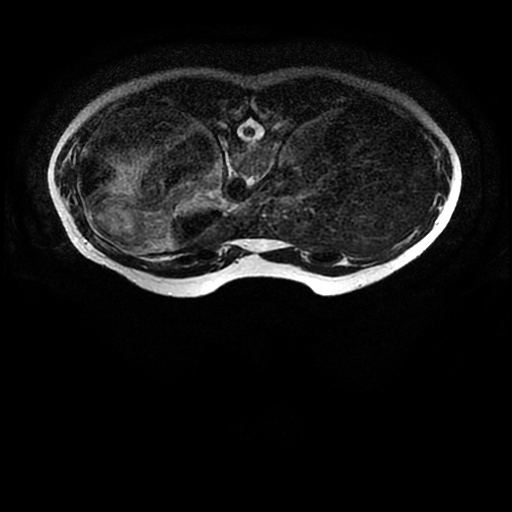

[Series 5: STIR · axial · 3.0mm · 1.45mm/px · z∈[-93,+103]mm · 3 of 100 slices shown (1 of 5)]
[im 1/100]
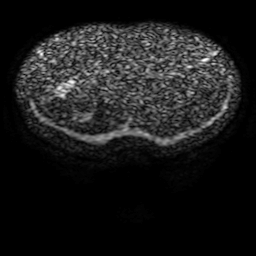
[im 50/100]
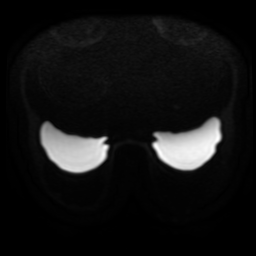
[im 100/100]
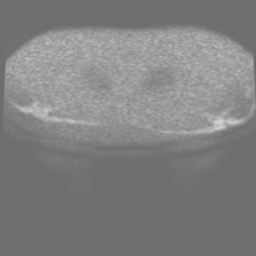

[Series 6: T1 · coronal · 3.0mm · 0.72mm/px · 1 of 30 slices shown (2 of 3)]
[im 1/30]
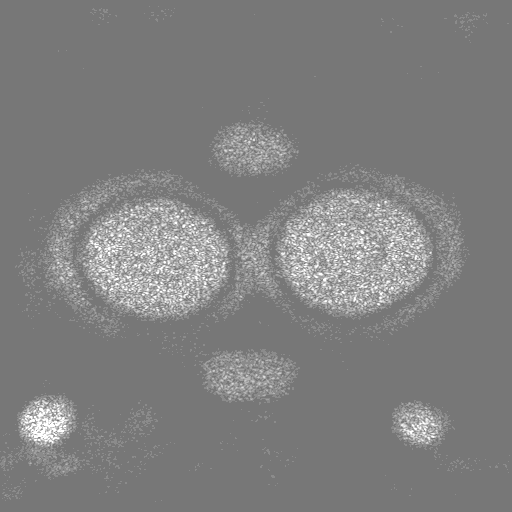

[Series 7: T1 · coronal · 3.0mm · 0.72mm/px · 1 of 45 slices shown (3 of 3)]
[im 1/45]
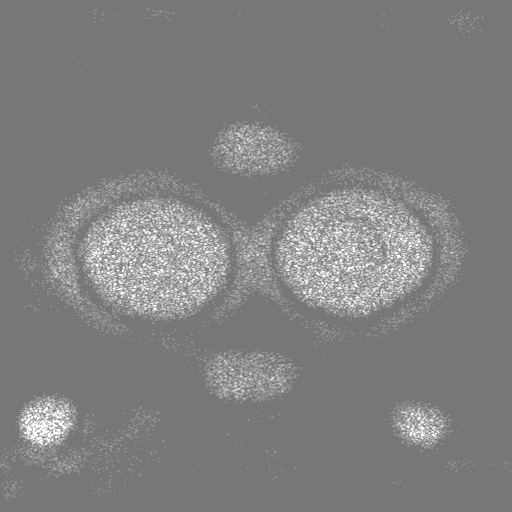

[Series 8: STIR · sagittal · 3.5mm · 0.43mm/px · 1 of 32 slices shown (2 of 5)]
[im 1/32]
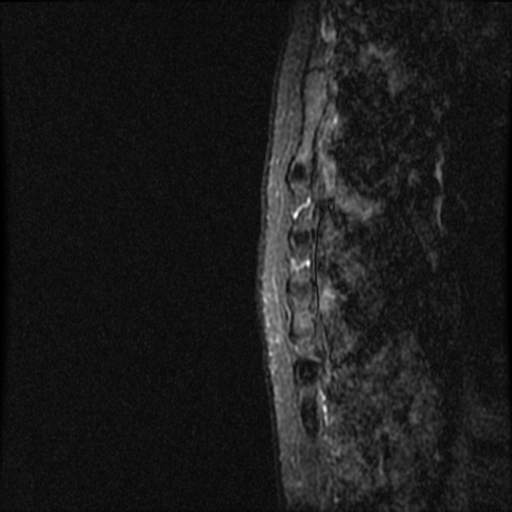

[Series 9: STIR · sagittal · 3.5mm · 0.43mm/px · 1 of 32 slices shown (3 of 5)]
[im 1/32]
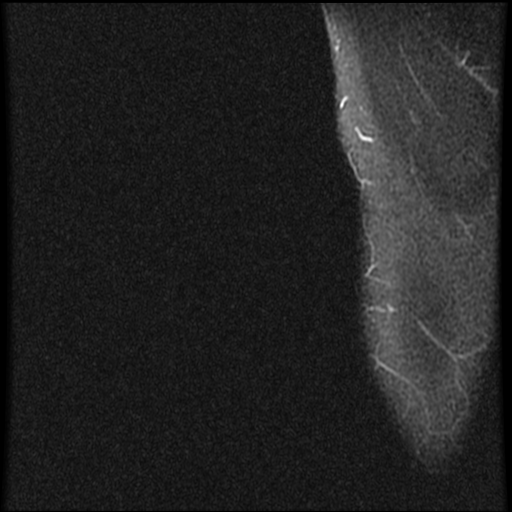

[Series 10: STIR · axial · 3.0mm · 0.72mm/px · z∈[-93,+103]mm · 2 of 50 slices shown (4 of 5)]
[im 1/50]
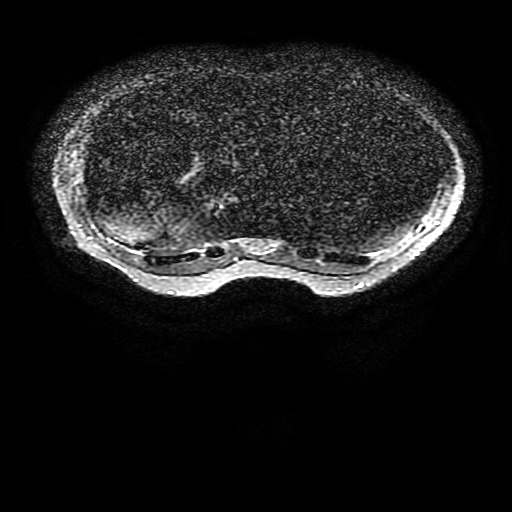
[im 50/50]
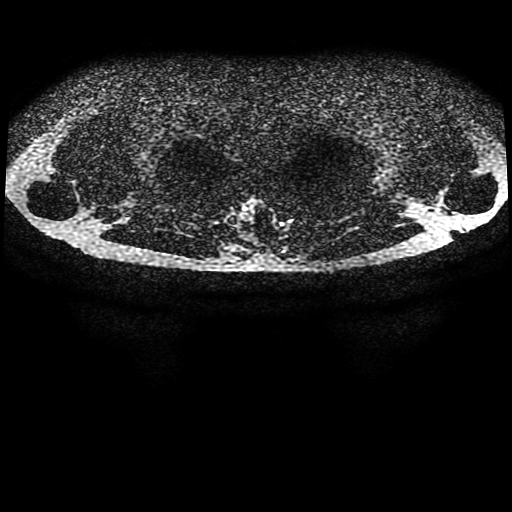

[Series 11: STIR · axial · 3.0mm · 0.72mm/px · z∈[-93,+103]mm · 2 of 50 slices shown (5 of 5)]
[im 1/50]
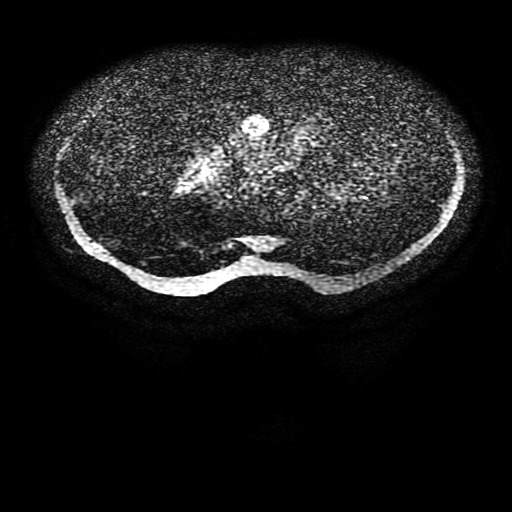
[im 50/50]
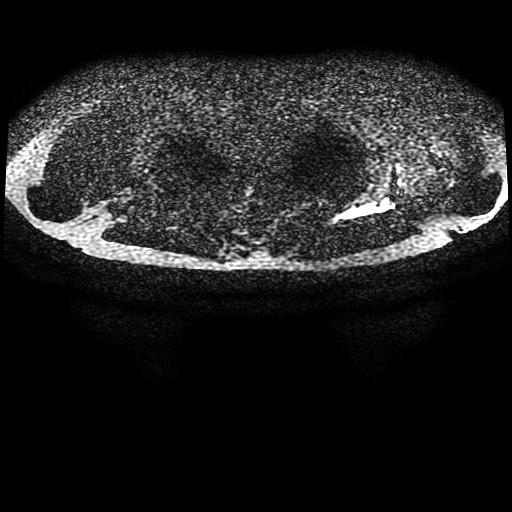

[Series 550: eadc:(date) (date) brt · axial · 3.0mm · 1.45mm/px · 1 of 43 slices shown]
[im 1/43]
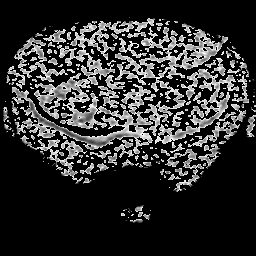

[Series 551: ADC · axial · 3.0mm · 1.45mm/px · z∈[-93,+103]mm · 2 of 50 slices shown]
[im 1/50]
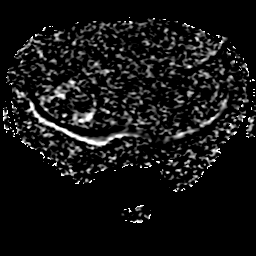
[im 50/50]
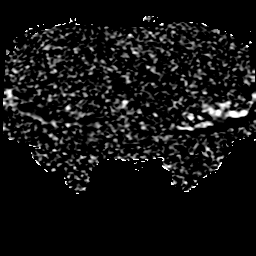

[16 of 16 positions shown; findings below may reference images not displayed]

Informação adicional: -
Médico: -
Indicação:
Avaliação de implantes mamários.
Metodologia:
Exame realizado com a técnica Spin-Eco e Turbo Spin-Eco com imagens multiplanares em T1 e T2, com especial
interesse para os implantes mamário nas sequências ponderadas em T2 com e sem supressão de agua, gordura e
silicone. Não houve injeção do meio de contraste paramagnético endovenoso.
Composição mamária:
Heterogeneamente ﬁbroglandular.
Análise:
RESSONÂNCIA MAGNÉTICA DE MAMAS
Pele, tecido subcutâneo e complexos areolopapilares de morfologia e sinal habituais.
Cistos simples medindo até 3,7 cm esparsos por ambas as mamas.
Implantes mamários de localização retropeitoral, lúmen único e conteúdo homogêneo, com pastilhas de fechamento
posteriores, íntegros, apresentando algumas dobras radiais, sem sinais de ruptura intra ou extracapsular.
Linfonodos axilares documentados de aspecto habitual ao método.
Musculatura peitoral e cadeias mamárias internas sem particularidades.
Comparação com exames anteriores:
Exames convencionais indisponíveis para correlação.
Impressão Radiológica:
Implantes mamários íntegros.
Cistos mamários bilaterais.
Observação: o estudo de ressonância magnética realizado sem contraste endovenoso, diminui a sensibilidade e
especiﬁcidade do método, não sendo possível classiﬁcá-lo quanto aos critérios ACR-BIRADS. 
Recomendação:
[facility] - [address] [place] [id number], [place]
Informação adicional: -
Médico: -
Não há sinais radiológicos de contratura capsular no estudo atual. O diagnóstico não é excludente, devendo ser
considerado o exame clínico.
[facility] - [address] [place] [id number], [place]

## 2024-08-12 ENCOUNTER — Other Ambulatory Visit (HOSPITAL_BASED_OUTPATIENT_CLINIC_OR_DEPARTMENT_OTHER): Payer: Self-pay | Admitting: Internal Medicine

## 2024-08-12 ENCOUNTER — Other Ambulatory Visit: Payer: Self-pay

## 2024-08-12 ENCOUNTER — Ambulatory Visit
Admission: RE | Admit: 2024-08-12 | Discharge: 2024-08-12 | Disposition: A | Discharge status: Home / Self Care | Payer: No Typology Code available for payment source | Attending: Internal Medicine | Admitting: Internal Medicine

## 2024-08-12 ENCOUNTER — Ambulatory Visit (HOSPITAL_BASED_OUTPATIENT_CLINIC_OR_DEPARTMENT_OTHER)
Admission: RE | Admit: 2024-08-12 | Discharge: 2024-08-12 | Disposition: A | Discharge status: Home / Self Care | Payer: No Typology Code available for payment source | Source: Ambulatory Visit

## 2024-08-12 ENCOUNTER — Encounter (HOSPITAL_BASED_OUTPATIENT_CLINIC_OR_DEPARTMENT_OTHER): Payer: Self-pay

## 2024-08-12 DIAGNOSIS — R928 Other abnormal and inconclusive findings on diagnostic imaging of breast: Secondary | ICD-10-CM | POA: Insufficient documentation

## 2024-08-12 DIAGNOSIS — N6314 Unspecified lump in the right breast, lower inner quadrant: Secondary | ICD-10-CM | POA: Diagnosis not present

## 2024-09-14 ENCOUNTER — Ambulatory Visit: Admitting: Internal Medicine

## 2024-10-31 ENCOUNTER — Other Ambulatory Visit: Payer: Self-pay

## 2024-10-31 ENCOUNTER — Encounter (HOSPITAL_BASED_OUTPATIENT_CLINIC_OR_DEPARTMENT_OTHER): Payer: Self-pay

## 2024-10-31 ENCOUNTER — Emergency Department
Admission: AD | Admit: 2024-10-31 | Discharge: 2024-10-31 | Disposition: A | Discharge status: Home / Self Care | Source: Ambulatory Visit | Attending: Emergency Medicine | Admitting: Emergency Medicine

## 2024-10-31 DIAGNOSIS — H6121 Impacted cerumen, right ear: Secondary | ICD-10-CM

## 2024-10-31 DIAGNOSIS — H9201 Otalgia, right ear: Secondary | ICD-10-CM

## 2024-10-31 DIAGNOSIS — S161XXA Strain of muscle, fascia and tendon at neck level, initial encounter: Secondary | ICD-10-CM | POA: Diagnosis present

## 2024-10-31 DIAGNOSIS — X58XXXA Exposure to other specified factors, initial encounter: Secondary | ICD-10-CM

## 2024-10-31 MED ORDER — METHOCARBAMOL 500 MG PO TABS
500.0000 mg | ORAL_TABLET | Freq: Three times a day (TID) | ORAL | 0 refills | Status: AC | PRN
Start: 2024-10-31 — End: 2024-11-10

## 2024-10-31 MED ORDER — LIDOCAINE 5 % EX PTCH
1.0000 | MEDICATED_PATCH | CUTANEOUS | 0 refills | Status: AC
Start: 2024-10-31 — End: 2024-11-05

## 2024-10-31 MED ORDER — IBUPROFEN 600 MG PO TABS
600.0000 mg | ORAL_TABLET | Freq: Four times a day (QID) | ORAL | 0 refills | Status: AC | PRN
Start: 2024-10-31 — End: 2024-11-10

## 2024-10-31 NOTE — UC Provider Notes (Signed)
 EMERGENCY DEPARTMENT RESIDENT NOTE    The ED nursing record was reviewed.   The prior medical records as available electronically through Epic were reviewed.  This patient was seen with Emergency Department attending physician Dr. Manya    CHIEF COMPLAINT    Patient presents with:  Arm Pain      HPI    Casey [name] is a 43 year old female who presents with Right shoulder pain and Right ear pain.   Patient reports that for the past month she started to noticed her Right ear is pounding. Like a wooshing like beating sensation in her Right ear similar to a heartbeat. She endorses mild hearing loss and intermittent tinnitus.     Patient started to experience Right shoulder / neck pain 7 days ago. Pain is described is described as sharp and throbbing and is worse with movement especially lifting her arms over her head. Pain makes it makes it hard to sleep at night. She has attempted to use tylenol  and ibuprofen  with little help. She denies any numbness, loss of sensation, trauma, or falls. .    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     Language Needs Met By: Telephone Interpreter [phone]      REVIEW OF SYSTEMS     The pertinent positives are reviewed in the HPI above. All other systems were reviewed and are negative.    PAST MEDICAL HISTORY    Past Medical History:  No date: HTN (hypertension)  06/17/2007: Irregular menstrual cycle  06/17/2007: Pelvic pain  No date: Pre-eclampsia    PROBLEM LIST  Patient Active Problem List:     Developmental speech or language disorder     Open wound of wrist with complication     Mild intermittent asthma     Tobacco use disorder      SURGICAL HISTORY    Past Surgical History:  No date: OB ANTEPARTUM CARE CESAREAN DLVR & POSTPARTUM      Comment:  age 85  No date: TONSILLECTOMY ONE-HALF AGE 84/>  No date: UNLISTED PROCEDURE PHARYNX ADENOIDS/TONSILS      Comment:  age  69    CURRENT MEDICATIONS    No current facility-administered medications for this  encounter.    Current Outpatient Medications:     methocarbamol (ROBAXIN) 500 MG tablet, Take 1 tablet by mouth 3 (three) times daily as needed (back/neck pain)  for up to 10 days, Disp: 30 tablet, Rfl: 0    ibuprofen  (ADVIL ) 600 MG tablet, Take 1 tablet by mouth every 6 (six) hours as needed for Pain  for up to 10 days, Disp: 30 tablet, Rfl: 0    lidocaine (LIDODERM) 5 % patch, Place 1 patch onto the skin in the morning for 5 days. . Patch(es) may remain in place for up to 12 hours in any 24-hour period., Disp: 5 patch, Rfl: 0    buPROPion  (WELLBUTRIN  XL) 150 MG 24 hr tablet, Take 1 tablet by mouth every morning, Disp: 90 tablet, Rfl: 1    albuterol  HFA (PROAIR  HFA) 108 (90 Base) MCG/ACT inhaler, Inhale 2 puffs into the lungs every 6 (six) hours as needed for Wheezing or Shortness of breath (when exposed to cat), Disp: 1 each, Rfl: 5    cholecalciferol  25 MCG (1000 UT) CAPS, Take 1 capsule by mouth daily, Disp: 90 capsule, Rfl: 3    diphenhydrAMINE (BENADRYL) 25 MG capsule, Take 25 mg by mouth every 6 (six) hours as needed for Itching.,  Disp: , Rfl:     ALLERGIES    Review of Patient's Allergies indicates:   Cat dander              Shortness of Breath    Comment:Improved with albuterol .    FAMILY HISTORY    Review of patient's family history indicates:  Problem: Thyroid       Relation: Mother          Age of Onset: (Not Specified)  Problem: In Good Health      Relation: Father          Age of Onset: (Not Specified)  Problem: Heart      Relation: Maternal Grandfather          Age of Onset: (Not Specified)  Problem: Heart      Relation: Maternal Uncle          Age of Onset: (Not Specified)          Comment: sudden, age 40  Problem: Cancer - Lung      Relation: Paternal Uncle          Age of Onset: (Not Specified)          Comment: non-smoker  Problem: Diabetes      Relation: FamHxNeg          Age of Onset: (Not Specified)      SOCIAL HISTORY    Social History     Socioeconomic History    Marital status: Single      Spouse name: Not on file    Number of children: 1    Years of education: Not on file    Highest education level: Not on file   Occupational History    Occupation: housecleaning   Tobacco Use    Smoking status: Light Smoker     Current packs/day: 0.25     Types: Cigarettes    Smokeless tobacco: Current    Tobacco comments:     3-4 cigs at home at night   Substance and Sexual Activity    Alcohol use: Yes     Alcohol/week: 0.0 standard drinks of alcohol     Comment: special occasions    Drug use: No    Sexual activity: Yes     Partners: Male     Birth control/protection: I.U.D.   Other Topics Concern    Military Service Not Asked    Blood Transfusions Not Asked    Caffeine Concern No     Comment: 1 cup coffee/day    Occupational Exposure Not Asked    Hobby Hazards Not Asked    Sleep Concern No    Stress Concern No    Weight Concern Not Asked    Special Diet Not Asked    Back Care Not Asked    Exercise Yes    Bike Helmet Not Asked    Seat Belt Not Asked    Self-Exams Yes   Social History Narrative    2021: living with partner of 19 year. Son in [place], living with her mother and sister, he's in college, doing really well. Works in human resources officer, sends money back to [place] to support her whole family.    Social Drivers of Catering Manager Strain: Not on file  Food Insecurity: Not on file  Transportation Needs: Not on file  Physical Activity: Not on file  Stress: Not on file  Social Connections: Not on file  Intimate Partner Violence: Not on file  Housing Stability: Not  on file      PHYSICAL EXAM      Vital Signs: BP 150/93   Pulse 102   Temp 98.2 F   Resp 14   LMP 10/03/2024   SpO2 100%      Constitutional: Well-developed, Well-nourished, Non-toxic appearance. Speaking full sentences.  HEENT: Right ear contains cerumen. Serous fluid present behind right ear drum.   Neck: No C-Spine Tenderness; Normal range of motion, non-tender, Supple with no meningismus; no stridor.   Lymphatic: No lymphadenopathy  noted.   Cardio.: RRR, No MRG's. Radial pulses 2+ B/L;  No LE edema  Pulmonary: Normal BS's bilat., No tachypnea, wheezes, rales or rhonchi;  Non-labored w/o retractions or accessory muscle use, or tripoding  Musculoskeletal:  Moving all 4 extremities, No major deformities noted. Ambulatory with steady gait. Reproducible pain when lifting arms laterally and on external rotation. Right lateral neck pain reproducible with shoulder shrugs. Tender to deep palpation on lateral shoulder.   Neurological: AOx3, No focal deficits noted. Sensation is normal, Motor strength is normal  Psychiatric: Appropriate for age and situation.      RESULTS  No results found for this visit on 10/31/24 (from the past 24 hours).     RADIOLOGY  None    EKG:  None    PROCEDURES  None    MEDICATIONS ADMINISTERED ON THIS VISIT  Orders Placed This Encounter      methocarbamol (ROBAXIN) 500 MG tablet          Sig: Take 1 tablet by mouth 3 (three) times daily as needed (back/neck pain)  for up to 10 days          Dispense:  30 tablet          Refill:  0      ibuprofen  (ADVIL ) 600 MG tablet          Sig: Take 1 tablet by mouth every 6 (six) hours as needed for Pain  for up to 10 days          Dispense:  30 tablet          Refill:  0      lidocaine (LIDODERM) 5 % patch          Sig: Place 1 patch onto the skin in the morning for 5 days. . Patch(es) may remain in place for up to 12 hours in any 24-hour period.          Dispense:  5 patch          Refill:  0      ED COURSE & MEDICAL DECISION MAKING      I reviewed the patient's past medical history/problem list, past surgical history, medication list, social history and allergies. Pertinent Labs & Imaging studies reviewed. Nursing notes reviewed.  Prior records reviewed. Patient remained hemodynamically stable during their stay in the emergency department.    ED Decision Making & Course: Pt is a 43 year old female who presents with right neck / shoulder pain and Right ear discomfort. On physical  examination, Right ear contained cerumen which was subsequently irrigated. Considering patient did not have any trauma and pain has been lasting for 1 week, it is likely to be musculoskeletal in nature. Rotator cuff injury is unlikely. Given the location and nature of the pain, it is likely a neck strain.           Clinical Impression:  Neck strain, initial encounter    Disposition:   Discharge  The patient agrees with emergency department management and verbalized understanding of instructions prior to discharge.     Patient Condition:  Stable

## 2024-10-31 NOTE — UC Triage Notes (Signed)
 Patient presents here today with atraumatic right neck and shoulder pain with some associated right ear pain onset 5 days ago.  APAP and IBU for 5 days.

## 2024-10-31 NOTE — Narrator Note (Signed)
Patient Disposition  Patient education for diagnosis, medications, activity, diet and follow-up.  Patient left UC 11:54 AM.  Patient rep received written instructions.    Interpreter to provide instructions: No    Patient belongings with patient: YES    Have all existing LDAs been addressed? N/A    Have all IV infusions been stopped? N/A    Destination: Home instructions reviewed.

## 2024-11-09 ENCOUNTER — Ambulatory Visit (HOSPITAL_BASED_OUTPATIENT_CLINIC_OR_DEPARTMENT_OTHER): Payer: Self-pay

## 2024-11-09 NOTE — Progress Notes (Signed)
 [email or web address]   Primary Care Office Visit    Language of care: Portuguese, interpreter used     Subjective:   Casey [name] 43 year old female with current problem list of:   Patient Active Problem List:     Developmental speech or language disorder     Open wound of wrist with complication     Mild intermittent asthma     Tobacco use disorder    Current Outpatient Medications   Medication Instructions    albuterol  HFA (PROAIR  HFA) 108 (90 Base) MCG/ACT inhaler 2 puffs, Inhalation, EVERY 6 HOURS PRN    buPROPion  (WELLBUTRIN  XL) 150 mg, Oral, EVERY MORNING    cholecalciferol  25 MCG (1000 UT) CAPS 1 capsule, Oral, DAILY    diphenhydrAMINE (BENADRYL) 25 mg, Oral, EVERY 6 HOURS PRN    gabapentin  (NEURONTIN ) 300 mg, Oral, Nightly       Presents today to address the following chief concerns:     # Neck pain  Patient is a 43 year old female with no pertinent past medical history that presents with persistent neck pain.    Patient was evaluated at urgent care on 10/31/2024.  At that time she reported she was having upper back/neck pain for about a week, with some right ear pain.  There was some scarring to right ear, no signs of infection and ultimately no treatment was pursued at that time.  In terms of neck pain, she was ultimately diagnosed with neck strain/sprain, discharged with lidocaine  patches and prescription of methocarbamol  500 mg 3 times daily as needed for pain x 10 days and ibuprofen  600 mg with as needed for pain every 6 hours for 10 days.  Patient has a labor-intensive job, she works as a advertising copywriter.    Total 3 weeks now  Today patient is reporting pain is not worse, not better, it is staying the same.   When she turn her head left she feels sharp shooting pain down the posterior side of her arm   Also having pain to R ear- she notes this is like a pulsation in her ear.   Hearing is normal  Does endorse some tinnitus   No dizziness  No vertigo  No precipitating activity, just woke up with  this pain.   Feels R arm weakness  Comes and goes, not constant.   No worsening of symptoms with exercise      Review of systems:   As per HPI     Medications, Allergies, Problem list, and Social history were all reviewed and updated in Epic as appropriate.     Reviewed prior notes from Primary Care, Urgent Care, ED, or recent specialities visits as they pertained to this encounter.    Reviewed Health maintenance needs.       Physical Exam:  BP 120/76 (Site: RA, Position: Sitting, Cuff Size: Reg)   Pulse 75   Temp 98 F (36.7 C) (Temporal)   Wt 46.3 kg (102 lb)   LMP 11/04/2024 (Approximate)   SpO2 100%   BMI 22.07 kg/m     Physical Exam  Constitutional:       General: She is not in acute distress.     Appearance: Normal appearance. She is not toxic-appearing.   HENT:      Right Ear: Ear canal and external ear normal. There is no impacted cerumen.      Left Ear: Ear canal and external ear normal. There is no impacted cerumen.      Ears:  Comments: There is significant scarring to the tympanic membrane right greater than left.  There are no signs of infection  Cardiovascular:      Comments: No bruits auscultated to right or left carotid artery.  Musculoskeletal:      Comments: There is tenderness to palpation over the right trapezius muscle.  Neck flexion, extension and rotation intact.  There is significant pain with rotation of the head to the left   Neurological:      Mental Status: She is alert.      Comments: Shoulder abduction, adduction, flexion, extension, internal rotation, external rotation 4 out of 5 to right side and 5 out of 5 to left side.  There is significant pain with these movements on the right side.    Grip strength is 5 out of 5 bilaterally.  There is no deficit to sensation to right or left entirety of upper extremity.              Assessment & Plan  Neck strain, subsequent encounter  Right sided numbness  Right sided weakness  Patient is a 43 year old female with past medical  history of tobacco use disorder that presents for persistent neck strain following treatment at urgent care last week on November 17.  She is diagnosed with right sided neck strain prescribed muscle relaxant, as needed ibuprofen  and lidocaine  patches.  She has utilized these without efficacy.    Today she is reporting to me that she has pulsatile tinnitus to her right ear (comes and goes), neuropathic pain shooting down her right posterior arm worse with rotation of her head to the left side and pain to the right side of her neck/upper back.  She has had this pain for a total of about 3 weeks now.  Denies dizziness, denies syncope or presyncope, denies vertigo.  She denies any precipitating event, she reports that this was present 1 day when she woke up.  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  Associated symptoms include right arm weakness.  Symptoms are not worse with exertion.  Pain appears to be the worst with sleeping, specifically her right sided radiculopathy    On exam today patient is tender to right side of her neck/upper back.  There is some reduced muscle strength to her right side with all shoulder movements compared to the left side.  There is significant scarring to TMs bilaterally right greater than left.  However there are no signs of infection.    DDx:  -Considered frozen shoulder, given smoking history.  However, patient is able to freely move arm through entirety of adduction and abduction and without difficulty when no resistance is applied.   - Considered vascular pathology driving pulsatile tinnitus, there is no bruit to auscultation over right/left carotid arteries.  Patient vital signs are stable blood pressure 120/76 heart rate 75.  Given length of time symptoms have been present, 3 weeks, I believe it is less likely that this is a stroke as I would expect patient's condition to continue to worsen without treatment.  Patient's symptoms are also not worsened with  exertion, lowering my concern for vascular abnormality.  - Considered inner ear etiology, however this would not drive patient's neck pain/neuropathic related pain down her arm.  However given that patient does have some apparent weakness to her right side will obtain MRI for evaluation of intracranial pathology/inner ear pathology.  - Did consider myelinating condition, intracranial mass.  Although I do think it is  likely the patient is suffering from a muscle strain with associated neuropathy and her weakness may be secondary to pain, given that I do feel weakness is present to her right side on exam I feel that I should obtain MRI to rule out other potential causes.        In terms of continued pain management, patient reports that symptoms are the worst at night, specifically the radiculopathy down her right arm.  I did consider a course of prednisone to try to reduce inflammation and neuropathic pain.  However given that patient is already having difficulty sleeping I would like to try to avoid this.  I will prescribe a short 10-day course of 300 mg gabapentin  nightly to help with sleep and to help reduce neuropathic pain.  It is my hope that symptoms will resolve on their own, but we will obtain nonurgent MRI to ensure there is not more serious explanation of her symptoms.  If symptoms resolve she may cancel this.    Plan:  Orders:    gabapentin  (NEURONTIN ) 300 MG capsule; Take 1 capsule by mouth at bedtime    MRI FACIAL STRUCTURES WO CONTRAST; Future    Mild intermittent asthma, unspecified whether complicated  Tobacco use disorder  Patient utilizes inhaler as needed, she does not require this every day.    We did discuss smoking cessation, she has trialed patches in the past, but does not feel that this is the right choice for her.  I did offer her NRT or p.o. therapy to quit smoking, she declines this at this time.  She would like to work on it herself and reports that she needs to work on her mentality  surrounding smoking.      Plan,  -Continue use of as needed albuterol  for coughing/wheezing  -Continue to support patient in smoking cessation efforts.         I spent a total of 52 minutes on this visit on the date of service (total time includes all activities performed on the date of service).  This includes clinical contact time, documentation and discussion of case with colleague.  This was all performed on the day of appointment.    Possible side effects of the new prescribed medication were explained. We discussed the patient's current medications.  We discussed the importance of medication compliance.     The patient expressed understanding and no barriers to adherence were identified.    The patient was ready to learn and no apparent learning or adherence barriers were identified. I explained the diagnosis and treatment plan, and the patient expressed understanding of the content. I attempted to answer any questions regarding the diagnosis and the proposed treatment.    Zaleah Ternes, PA-C, 11/11/2024

## 2024-11-09 NOTE — Telephone Encounter (Signed)
 Adult General Triage    I spoke with Patient:     Chief Complaint: f/u UC for neck strain     Current Day of Symptoms: 2 weeks     Patient Active Problem List:     Developmental speech or language disorder     Open wound of wrist with complication     Mild intermittent asthma     Tobacco use disorder      Review of Patient's Allergies indicates:   Cat dander              Shortness of Breath    Comment:Improved with albuterol .    Emergency care:     Concern for life threatening condition:  No     The patient has the following symptoms:     Went to UC last week for neck strain and R ear pain  Feels the same the pain is worse at night  R ear symptom and neck strain unchanged  Continues with muscle relaxant  Denies numbness or tingling   Describes this as a throbbing pain     Home Remedies:  taking muscle relaxants prescribed by UC      Advised per nursing triage protocol.         Recommended disposition for patient:  Disposition: See in Office within 3 days     If patient referred to UC/ED advised that they may require further follow up and testing after the visit with their primary care office.     Instructed patient to call back for any new, worsening, or worrisome symptoms or concerns any time day or night.    Advised per nursing triage protocol.     Telephone Call Outcome:  Single Call Resolution        Reason for Disposition   Neck pain persists > 2 weeks    Answer Assessment - Initial Assessment Questions  Scheduled Appointments (Next 7 days)    Nov 11, 2024 4:00 PM  (Arrive by 3:45 PM)  URGENT CARE with [place]  Pasquale RIGGERS  [facility]) [address]  [place] [address]  [phone]   Please arrive 15 minutes prior to your appointment for Registration   Process    Protocols used: Neck Pain or Stiffness-A-OH

## 2024-11-11 ENCOUNTER — Encounter (HOSPITAL_BASED_OUTPATIENT_CLINIC_OR_DEPARTMENT_OTHER): Payer: Self-pay

## 2024-11-11 ENCOUNTER — Other Ambulatory Visit: Payer: Self-pay

## 2024-11-11 ENCOUNTER — Ambulatory Visit

## 2024-11-11 VITALS — BP 120/76 | HR 75 | Temp 98.0°F | Wt 102.0 lb

## 2024-11-11 DIAGNOSIS — J4520 Mild intermittent asthma, uncomplicated: Secondary | ICD-10-CM | POA: Diagnosis present

## 2024-11-11 DIAGNOSIS — R531 Weakness: Secondary | ICD-10-CM

## 2024-11-11 DIAGNOSIS — R200 Anesthesia of skin: Secondary | ICD-10-CM | POA: Diagnosis present

## 2024-11-11 DIAGNOSIS — F17200 Nicotine dependence, unspecified, uncomplicated: Secondary | ICD-10-CM

## 2024-11-11 DIAGNOSIS — S161XXD Strain of muscle, fascia and tendon at neck level, subsequent encounter: Secondary | ICD-10-CM | POA: Diagnosis present

## 2024-11-11 MED ORDER — GABAPENTIN 300 MG PO CAPS: 300.0000 mg | ORAL_CAPSULE | Freq: Every evening | ORAL | 0 refills | Status: DC

## 2024-11-11 NOTE — Assessment & Plan Note (Addendum)
 Patient utilizes inhaler as needed, she does not require this every day.    We did discuss smoking cessation, she has trialed patches in the past, but does not feel that this is the right choice for her.  I did offer her NRT or p.o. therapy to quit smoking, she declines this at this time.  She would like to work on it herself and reports that she needs to work on her mentality surrounding smoking.      Plan,  -Continue use of as needed albuterol  for coughing/wheezing  -Continue to support patient in smoking cessation efforts.

## 2024-11-11 NOTE — Patient Instructions (Signed)
MRI: [phone]

## 2024-11-11 NOTE — Assessment & Plan Note (Signed)
 Patient utilizes inhaler as needed, she does not require this every day.    We did discuss smoking cessation, she has trialed patches in the past, but does not feel that this is the right choice for her.  I did offer her NRT or p.o. therapy to quit smoking, she declines this at this time.  She would like to work on it herself and reports that she needs to work on her mentality surrounding smoking.      Plan,  -Continue use of as needed albuterol  for coughing/wheezing  -Continue to support patient in smoking cessation efforts.

## 2024-12-09 ENCOUNTER — Ambulatory Visit (HOSPITAL_BASED_OUTPATIENT_CLINIC_OR_DEPARTMENT_OTHER): Payer: Self-pay

## 2024-12-09 ENCOUNTER — Ambulatory Visit
Admission: RE | Admit: 2024-12-09 | Discharge: 2024-12-09 | Disposition: A | Payer: Self-pay | Attending: Diagnostic Radiology | Admitting: Diagnostic Radiology

## 2024-12-09 ENCOUNTER — Other Ambulatory Visit: Payer: Self-pay

## 2024-12-09 DIAGNOSIS — H93A9 Pulsatile tinnitus, unspecified ear: Secondary | ICD-10-CM | POA: Diagnosis not present

## 2024-12-09 DIAGNOSIS — R531 Weakness: Secondary | ICD-10-CM | POA: Insufficient documentation

## 2024-12-09 DIAGNOSIS — M542 Cervicalgia: Secondary | ICD-10-CM | POA: Insufficient documentation

## 2024-12-09 DIAGNOSIS — R200 Anesthesia of skin: Secondary | ICD-10-CM

## 2024-12-09 MED ORDER — GADOTERIDOL 279.3 MG/ML IV SOLN
1.0000 mL | Freq: Once | INTRAVENOUS | Status: AC
  Administered 2024-12-09: 10 mL via INTRAVENOUS

## 2024-12-12 ENCOUNTER — Ambulatory Visit: Attending: Internal Medicine | Admitting: Internal Medicine

## 2024-12-12 DIAGNOSIS — M792 Neuralgia and neuritis, unspecified: Secondary | ICD-10-CM | POA: Insufficient documentation

## 2024-12-12 MED ORDER — GABAPENTIN 300 MG PO CAPS: 300.0000 mg | ORAL_CAPSULE | Freq: Three times a day (TID) | ORAL | 0 refills | Status: AC | PRN

## 2024-12-12 NOTE — Progress Notes (Unsigned)
 Cc  ER f/u     S/  Casey [name]   [phone] tel inter    Urgent care f/u    R Neck pain.   R upper back/neck, sometimes to R elbow.  Got relaxer.   Didn't help.     Also, had MRI of IACs, because the pain was spreading from the right neck to the right ear    Neck, arm pain are no better.   Robaxin , gaba, ibuprofen  were tried.   Gabapentin  was helpful.  She was taking it at night.  It helped her sleep, and improved pain considerably. when she sneezes, it really hurts much more.      Patient Active Problem List:     Developmental speech or language disorder     Open wound of wrist with complication     Mild intermittent asthma     Tobacco use disorder    Current Outpatient Medications   Medication Sig         buPROPion  (WELLBUTRIN  XL) 150 MG 24 hr tablet Take 1 tablet by mouth every morning    albuterol  HFA (PROAIR  HFA) 108 (90 Base) MCG/ACT inhaler Inhale 2 puffs into the lungs every 6 (six) hours as needed for Wheezing or Shortness of breath (when exposed to cat)    cholecalciferol  25 MCG (1000 UT) CAPS Take 1 capsule by mouth daily    diphenhydrAMINE (BENADRYL) 25 MG capsule Take 25 mg by mouth every 6 (six) hours as needed for Itching.     No current facility-administered medications for this visit.         O/    Audio televisit  Cogent and articulate    A/p    1) cervical radiculopathy  Gabapentin  refill  She was cautioned very carefully that this medication can cause drowsiness.  She must make sure that she is alert and coordinated before attempting to drive or similar activities.  She indicated she understood.    Back pain clinic referral

## 2024-12-14 ENCOUNTER — Telehealth (HOSPITAL_BASED_OUTPATIENT_CLINIC_OR_DEPARTMENT_OTHER): Payer: Self-pay | Admitting: Internal Medicine

## 2024-12-14 NOTE — Telephone Encounter (Signed)
 Unable to reach after multiple attempts.Left message to call and book an appointment for the Back Pain clinic.

## 2024-12-20 ENCOUNTER — Telehealth (HOSPITAL_BASED_OUTPATIENT_CLINIC_OR_DEPARTMENT_OTHER): Payer: Self-pay | Admitting: Internal Medicine

## 2024-12-20 NOTE — Telephone Encounter (Signed)
-----   Message from Ozell, Medical Receptionist sent at 12/20/2024  3:17 PM EST -----  Regarding: RE: needs appt in back pain clinic  Tried again Dr. Dr. Romelle no answer.I left another message.    -mlo  ----- Message -----  From: Elbert Lenis, MD  Sent: 12/20/2024  11:47 AM EST  To: [facility] Front Desk Pool  Subject: FW: needs appt in back pain clinic               Please try out reaching to this patient 1 more time  Thanks  Dr. FORBES  ----- Message -----  From: Benigno Ozell  Sent: 12/14/2024  10:17 AM EST  To: Lenis Elbert, MD  Subject: RE: needs appt in back pain clinic               Unable to reach after multiple attempts.Left message to call and book an appointment for the Back Pain clinic.    -mlo  ----- Message -----  From: Elbert Lenis, MD  Sent: 12/13/2024  11:34 AM EST  To: [facility] Front Desk Pool  Subject: needs appt in back pain clinic                   Please give her an appointment and back pain clinic  Thank you  Dr. Elbert

## 2025-01-11 ENCOUNTER — Other Ambulatory Visit: Payer: Self-pay

## 2025-01-11 ENCOUNTER — Ambulatory Visit (HOSPITAL_BASED_OUTPATIENT_CLINIC_OR_DEPARTMENT_OTHER): Payer: Self-pay | Admitting: Internal Medicine

## 2025-01-11 ENCOUNTER — Encounter (HOSPITAL_BASED_OUTPATIENT_CLINIC_OR_DEPARTMENT_OTHER): Payer: Self-pay | Admitting: Internal Medicine

## 2025-01-11 ENCOUNTER — Ambulatory Visit: Attending: Internal Medicine | Admitting: Internal Medicine

## 2025-01-11 VITALS — BP 145/88 | HR 77 | Temp 97.3°F | Ht <= 58 in | Wt 105.0 lb

## 2025-01-11 DIAGNOSIS — F17200 Nicotine dependence, unspecified, uncomplicated: Secondary | ICD-10-CM | POA: Insufficient documentation

## 2025-01-11 DIAGNOSIS — M792 Neuralgia and neuritis, unspecified: Secondary | ICD-10-CM | POA: Diagnosis present

## 2025-01-11 DIAGNOSIS — S161XXD Strain of muscle, fascia and tendon at neck level, subsequent encounter: Secondary | ICD-10-CM | POA: Diagnosis present

## 2025-01-11 DIAGNOSIS — R030 Elevated blood-pressure reading, without diagnosis of hypertension: Secondary | ICD-10-CM | POA: Diagnosis present

## 2025-01-11 LAB — BASIC METABOLIC PANEL
ANION GAP: 7 mmol/L — ABNORMAL LOW (ref 10–22)
BUN (UREA NITROGEN): 6 mg/dL — ABNORMAL LOW (ref 7–18)
CALCIUM: 9.8 mg/dL (ref 8.5–10.5)
CARBON DIOXIDE: 28 mmol/L (ref 21–32)
CHLORIDE: 105 mmol/L (ref 98–107)
CREATININE: 0.7 mg/dL (ref 0.4–1.2)
ESTIMATED GLOMERULAR FILT RATE: >60 mL/min (ref >60)
Glucose Random: 85 mg/dL (ref 74–160)
POTASSIUM: 4.6 mmol/L (ref 3.5–5.1)
SODIUM: 140 mmol/L (ref 136–145)

## 2025-01-11 LAB — HEPATIC FUNCTION PANEL
ALANINE AMINOTRANSFERASE: 20 U/L (ref 12–45)
ALBUMIN: 4.5 g/dL (ref 3.4–5.2)
ALKALINE PHOSPHATASE: 41 U/L — ABNORMAL LOW (ref 45–117)
ASPARTATE AMINOTRANSFERASE: 19 U/L (ref 8–34)
BILIRUBIN DIRECT: 0.2 mg/dL (ref 0.0–0.2)
BILIRUBIN TOTAL: 0.5 mg/dL (ref 0.2–1.0)
INDIRECT BILIRUBIN: 0.3 mg/dL (ref 0.2–0.9)
TOTAL PROTEIN: 6.8 g/dL (ref 6.4–8.2)

## 2025-01-11 LAB — CBC WITH PLATELET
ABSOLUTE NRBC COUNT: 0 10*3/uL (ref 0.0–0.0)
HEMATOCRIT: 39.1 % (ref 34.1–44.9)
HEMOGLOBIN: 12.8 g/dL (ref 11.2–15.7)
MEAN CORP HGB CONC: 32.7 g/dL (ref 31.0–37.0)
MEAN CORPUSCULAR HGB: 31.8 pg (ref 26.0–34.0)
MEAN CORPUSCULAR VOL: 97.3 fl (ref 80.0–100.0)
MEAN PLATELET VOLUME: 10.3 fL (ref 8.7–12.5)
NRBC %: 0 % (ref 0.0–0.0)
PLATELET COUNT: 334 10*3/uL (ref 150–400)
RBC DISTRIBUTION WIDTH STD DEV: 43 fL (ref 35.1–46.3)
RED BLOOD CELL COUNT: 4.02 M/uL (ref 3.90–5.20)
WHITE BLOOD CELL COUNT: 7.6 10*3/uL (ref 4.0–11.0)

## 2025-01-11 LAB — THYROID SCREEN TSH REFLEX FT4: THYROID SCREEN TSH REFLEX FT4: 1.83 u[IU]/mL (ref 0.270–4.200)

## 2025-01-11 LAB — URINALYSIS
BILIRUBIN, URINE: NEGATIVE
GLUCOSE, URINE: NEGATIVE mg/dL
KETONE, URINE: NEGATIVE mg/dL
LEUKOCYTES, URINE: NEGATIVE
NITRITE, URINE: NEGATIVE
OCCULT BLOOD, URINE: NEGATIVE
PH, URINE: 7 (ref 5.0–8.0)
PROTEIN, URINE: NEGATIVE mg/dL
SPECIFIC GRAVITY, URINE: 1.006 (ref 1.003–1.035)
UROBILINOGEN, URINE: 0.2 U/dL (ref 0.2–1.0)

## 2025-01-11 LAB — MICROALBUMIN RANDOM URINE
ALBUMIN URINE RANDOM: <1.2 mg/dL (ref 0.0–1.9)
CREATININE RANDOM URINE: 27 mg/dL — ABNORMAL LOW (ref 28–217)

## 2025-01-11 LAB — URINE PREGNANCY TEST (POINT OF CARE): HCG QUALITATIVE URINE: NEGATIVE

## 2025-01-11 MED ORDER — NICOTINE 7 MG/24HR TD PT24: 1.0000 | MEDICATED_PATCH | TRANSDERMAL | 1 refills | Status: AC

## 2025-01-11 MED ORDER — VITAMIN D (ERGOCALCIFEROL) 1.25 MG (50000 UT) PO CAPS: 1.0000 | ORAL_CAPSULE | ORAL | 0 refills | Status: AC

## 2025-01-11 MED ORDER — NICOTINE POLACRILEX 4 MG MT GUM: 4.0000 mg | CHEWING_GUM | OROMUCOSAL | 2 refills | Status: AC | PRN

## 2025-01-11 NOTE — Progress Notes (Signed)
 [email or web address]    CC: neck pain    HPI: Casey [name] is a 44 year old patient who presents to clinic for:    Pre-charting: f/u back pain - seen last year for neck/back pain. Also for ringing in ears (auditory MRI okay), some fluid.     History of Present Illness  Casey [name] is a 44 year old female who presents with persistent right-sided neck and shoulder pain.    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     Auditory symptoms  - Intermittent 'whoosh, whoosh, whoosh' sound in right ear  - Episodes coincide with strong pain in right arm  - MRI performed for ear symptoms revealed a cyst in the sinus, but no vascular issues in the head or neck.   - No nasal congestion or excessive mucus production    Menstrual and pregnancy concerns  - Concern for possible pregnancy after one positive test followed by three negative tests  - Last menstrual period on December 10, 2024  - Uncertain if a period has been missed  - Not currently using contraception  - Has a boyfriend    General malaise and fatigue  - Generalized feeling of being unwell  - Low energy and motivation  - Describes feeling 'sick' and 'not normal'  - No desire to work or engage in activities    Hypertension and cardiovascular risk  - History of preeclampsia during pregnancy  - Concern regarding current high blood pressure readings at visit today  - Family history of hypertension (father) and myocardial infarction  (uncle)  - No family history of diabetes    Tobacco use  - Smokes three to four cigarettes per day, primarily after work    Review of systems:   Negative except as above and for:      Medications, Allergies, Problem list, and Social history were all reviewed and updated in Epic as appropriate.       Physical Exam:  BP 145/88 (Site: RA)   Pulse 77   Temp 97.3 F (36.3 C) (Temporal)   Ht 4' 10 (1.473 m)   Wt 47.6 kg (105 lb)   SpO2 100%   BMI 21.95 kg/m     Normal findings:  Gen: well appearing, in no distress  Psych: mood is good, affect is bright with normal range, speech fluent and coherent  HEENT: PERRL, EOMI, no icterus. TMs clear, throat non-erythematous and without exudate.   NECK: no palpable lymph nodes, no thyroid  enlargement or masses,   CV: RRR, no murmurs  Lung: good air movement bilaterally, no wheezes, crackles or ronchi.  Abd: soft, non-tender, non-distended  Ext: 2+ pedal pulses bilaterally, no lower extremity edema  Neuro: strength and sensation grossly intact bilaterally. Gait is narrow-based and stable.  Skin: no obvious rashes or lesions    Additional findings:  Physical Exam  VITALS: BP- 145/88  NECK: No cervical lymphadenopathy. Upper back muscles tight with tenderness over trapezius  muscle. No carotid bruits. No spinal tenderness.  MUSCULOSKELETAL: Right shoulder pain on abduction. Normal strength in right arm.      Results  Radiology  Brain MRI w/contrast (10/2024): Normal brain parenchyma, no mass, no neoplasm, no infarct, no hemorrhage, no demyelination, normal cerebral vasculature, incidental sinus cyst with mild sinus mucosal thickening.    ASSESSMENT/PLAN:    Assessment & Plan  Cervical radiculopathy with right upper extremity pain  Chronic right upper extremity pain, improving over time. Pain radiates from the right side of the neck across the top of the right shoulder and down the arm to the elbow. No signs of nerve damage or significant weakness. Likely due to muscle spasm or  minor disc displacement. No need for further imaging or specialist referral at this time.  - Continue gabapentin  at night for pain and sleep.  - Monitor symptoms; if pain worsens or new symptoms develop, will consider MRI of the neck.    Elevated blood pressure  Intermittent elevated blood pressure readings, with today's reading at 145/88. Family history of hypertension and heart disease. Possible contributing factors include smoking and stress. No immediate need for antihypertensive medication.  - Ordered blood tests to check electrolytes, kidney function, liver tests, blood counts, thyroid  function, and anemia.  - Ordered urine test to check for kidney damage.  - Provided information on healthy eating for blood pressure management.  - Advised home blood pressure monitoring.  - Scheduled follow-up appointment in three months to reassess blood pressure.    Tobacco use disorder  Current smoker, smoking 3-4 cigarettes per day. Smoking primarily occurs at home after work. Previous attempts to quit using nicotine  replacement therapy.   - Prescribed nicotine  patches and gum to aid smoking cessation.  - Advised to keep the nicotine  patch on at all times and use gum as needed for cravings.    Low energy  Reports low energy and motivation, possibly related to lifestyle factors. Previous blood tests showed normal hormone levels and no anemia. Vitamin D  levels were normal in July but may be low in winter due to reduced sun exposure.  - Prescribed high-dose vitamin D  once weekly.  - Encouraged starting a small exercise routine, such as 10 minutes of exercise on Mondays.  - routine labs - CMP, CBC, TSH    The patient was ready to learn and no apparent learning or adherence barriers were identified. I explained the diagnosis and treatment plan, and the patient expressed understanding of the content. I attempted to answer any questions regarding the diagnosis and the proposed treatment.     We discussed the patients current  medications.  We discussed the importance of medication compliance. The patient expressed understanding and no barriers to adherence were identified.    she has been advised to call or return with any worsening or new problems    The patient verbally consented to an audio recording of their visit to assist with the completion of documentation. The patient is aware the recording is not retained after the visit is summarized.    Asberry FORBES Gosling, MD 01/15/2025

## 2025-02-15 ENCOUNTER — Ambulatory Visit

## 2025-04-12 ENCOUNTER — Ambulatory Visit: Admitting: Internal Medicine
